# Patient Record
Sex: Male | Born: 1955 | Race: Black or African American | Hispanic: No | Marital: Single | Smoking: Never smoker
Health system: Southern US, Community
[De-identification: ages and names within clinical notes are randomized; demographics above are authoritative.]

## PROBLEM LIST (undated history)

## (undated) DIAGNOSIS — N529 Male erectile dysfunction, unspecified: Secondary | ICD-10-CM

## (undated) DIAGNOSIS — E7800 Pure hypercholesterolemia, unspecified: Secondary | ICD-10-CM

## (undated) DIAGNOSIS — I8390 Asymptomatic varicose veins of unspecified lower extremity: Secondary | ICD-10-CM

## (undated) DIAGNOSIS — I10 Essential (primary) hypertension: Secondary | ICD-10-CM

## (undated) DIAGNOSIS — K209 Esophagitis, unspecified without bleeding: Secondary | ICD-10-CM

---

## 2005-04-24 ENCOUNTER — Emergency Department: Payer: Self-pay | Admitting: General Practice

## 2007-05-07 ENCOUNTER — Ambulatory Visit: Payer: Self-pay | Admitting: Internal Medicine

## 2009-11-28 ENCOUNTER — Ambulatory Visit: Payer: Self-pay | Admitting: Internal Medicine

## 2010-10-13 DIAGNOSIS — K209 Esophagitis, unspecified without bleeding: Secondary | ICD-10-CM | POA: Insufficient documentation

## 2011-02-20 ENCOUNTER — Ambulatory Visit: Payer: Self-pay | Admitting: Internal Medicine

## 2013-12-27 DIAGNOSIS — Z0000 Encounter for general adult medical examination without abnormal findings: Secondary | ICD-10-CM | POA: Insufficient documentation

## 2013-12-27 DIAGNOSIS — N529 Male erectile dysfunction, unspecified: Secondary | ICD-10-CM | POA: Insufficient documentation

## 2016-07-02 ENCOUNTER — Encounter: Payer: Self-pay | Admitting: Emergency Medicine

## 2016-07-02 ENCOUNTER — Emergency Department
Admission: EM | Admit: 2016-07-02 | Discharge: 2016-07-02 | Disposition: A | Discharge status: Home / Self Care | Payer: Commercial Managed Care - PPO | Attending: Emergency Medicine | Admitting: Emergency Medicine

## 2016-07-02 DIAGNOSIS — Z87891 Personal history of nicotine dependence: Secondary | ICD-10-CM | POA: Insufficient documentation

## 2016-07-02 DIAGNOSIS — R509 Fever, unspecified: Secondary | ICD-10-CM | POA: Diagnosis present

## 2016-07-02 DIAGNOSIS — J111 Influenza due to unidentified influenza virus with other respiratory manifestations: Secondary | ICD-10-CM | POA: Insufficient documentation

## 2016-07-02 MED ORDER — OSELTAMIVIR PHOSPHATE 75 MG PO CAPS: 75.0000 mg | ORAL_CAPSULE | Freq: Two times a day (BID) | ORAL | 0 refills | Status: DC

## 2016-07-02 MED ORDER — AZITHROMYCIN 250 MG PO TABS: ORAL_TABLET | ORAL | 0 refills | Status: DC

## 2016-07-02 MED ORDER — PROMETHAZINE-DM 6.25-15 MG/5ML PO SYRP: 5.0000 mL | ORAL_SOLUTION | Freq: Four times a day (QID) | ORAL | 0 refills | Status: DC | PRN

## 2016-07-02 NOTE — ED Provider Notes (Signed)
[facility] Emergency Department Provider Note  ____________________________________________  Time seen: Approximately 7:22 AM  I have reviewed the triage vital signs and the nursing notes.   HISTORY  Chief Complaint Fever    HPI Ryan Gallegos is a 61 y.o. male , NAD, presents to the emergency department today history of fever, chills, body aches, cough and chest congestion. Patient states that he began to feel approximately 2 days ago. At that time felt fatigued and had some minor joint aches. Over the last 24 hours has had onset of chest congestion with productive cough and increase of body aches. Has been taking TheraFlu over-the-counter with mild relief of his symptoms but no resolution. Denies any sick contacts. Did receive the flu vaccine in September October 2017. His wife who is at the bedside notes that this is the third illness the patient has had in the last 3 months. States he has had flulike symptoms 2 times prior to this illness with the last illness approximately 3 weeks ago. Patient denies any chest pain, shortness breath, wheezing, abdominal pain, nausea, vomiting or diarrhea. Has had some mild nasal congestion but no sinus pressure, ear pressure or ear pain. Denies headaches.   History reviewed. No pertinent past medical history.  There are no active problems to display for this patient.   History reviewed. No pertinent surgical history.  Prior to Admission medications   Medication Sig Start Date End Date Taking? Authorizing Provider  azithromycin (ZITHROMAX Z-PAK) 250 MG tablet Take 2 tablets (500 mg) on  Day 1,  followed by 1 tablet (250 mg) once daily on Days 2 through 5. 07/02/16   Jami L Hagler, PA-C  oseltamivir (TAMIFLU) 75 MG capsule Take 1 capsule (75 mg total) by mouth 2 (two) times daily. 07/02/16   Jami L Hagler, PA-C  promethazine-dextromethorphan (PROMETHAZINE-DM) 6.25-15 MG/5ML syrup Take 5 mLs by mouth 4 (four) times daily as  needed for cough. 07/02/16   Jami L Hagler, PA-C    Allergies Patient has no known allergies.  No family history on file.  Social History Social History  Substance Use Topics  . Smoking status: Former Games developermoker  . Smokeless tobacco: Never Used  . Alcohol use No     Review of Systems  Constitutional: Positive fever, chills, fatigue. No decreased appetite Eyes: No visual changes.  ENT: Positive nasal congestion, runny nose. No sore throat or sinus pressure, ear pressure, ear drainage. Cardiovascular: No chest pain. Respiratory: Positive cough, chest congestion. No shortness of breath. No wheezing.  Gastrointestinal: No abdominal pain.  No nausea, vomiting.  No diarrhea.   Musculoskeletal: Positive for general myalgias. Negative for back pain.  Skin: Negative for rash. Neurological: Negative for headaches. 10-point ROS otherwise negative.  ____________________________________________   PHYSICAL EXAM:  VITAL SIGNS: ED Triage Vitals [07/02/16 0659]  Enc Vitals Group     BP 106/69     Pulse Rate 95     Resp 18     Temp 99 F (37.2 C)     Temp Source Oral     SpO2 98 %     Weight 182 lb (82.6 kg)     Height 5\' 6"  (1.676 m)     Head Circumference      Peak Flow      Pain Score 8     Pain Loc      Pain Edu?      Excl. in GC?      Constitutional: Alert and oriented. Well appearing  and in no acute distress. Eyes: Conjunctivae are normal Without icterus, injection or discharge. Head: Atraumatic. ENT:      Ears: TMs visualized bilaterally without erythema, effusion, bulging or perforation.      Nose: Mild congestion with clear rhinorrhea.      Mouth/Throat: Mucous membranes are moist. Pharynx without erythema, swelling, exudate. Uvula is midline. Airway is patent. Clear postnasal drainage. Neck: No stridor. Supple with full range of motion. Hematological/Lymphatic/Immunilogical: No cervical lymphadenopathy. Cardiovascular: Normal rate, regular rhythm. Normal S1 and S2.   Good peripheral circulation. Respiratory: Normal respiratory effort without tachypnea or retractions. Lungs CTAB with breath sounds noted in all lung fields. No wheeze, rhonchi, rales. Neurologic:  Normal speech and language. No gross focal neurologic deficits are appreciated.  Skin:  Skin is warm, dry and intact. No rash noted. Psychiatric: Mood and affect are normal. Speech and behavior are normal. Patient exhibits appropriate insight and judgement.   ____________________________________________   LABS  None ____________________________________________  EKG  None ____________________________________________  RADIOLOGY  None ____________________________________________    PROCEDURES  Procedure(s) performed: None   Procedures   Medications - No data to display   ____________________________________________   INITIAL IMPRESSION / ASSESSMENT AND PLAN / ED COURSE  Pertinent labs & imaging results that were available during my care of the patient were reviewed by me and considered in my medical decision making (see chart for details).     Patient's diagnosis is consistent with influenza.  Patient will be discharged home with prescriptions for azithromycin, Tamiflu and promethazine DM to take as directed. Patient may continue over-the-counter Tylenol or ibuprofen as needed. Patient is to follow up with [facility] or [facility] if symptoms persist past this treatment course. Patient is given ED precautions to return to the ED for any worsening or new symptoms.    ____________________________________________  FINAL CLINICAL IMPRESSION(S) / ED DIAGNOSES  Final diagnoses:  Influenza      NEW MEDICATIONS STARTED DURING THIS VISIT:  Discharge Medication List as of 07/02/2016  7:34 AM    START taking these medications   Details  azithromycin (ZITHROMAX Z-PAK) 250 MG tablet Take 2 tablets (500 mg) on  Day 1,  followed by 1 tablet (250 mg)  once daily on Days 2 through 5., Print    oseltamivir (TAMIFLU) 75 MG capsule Take 1 capsule (75 mg total) by mouth 2 (two) times daily., Starting Thu 07/02/2016, Print    promethazine-dextromethorphan (PROMETHAZINE-DM) 6.25-15 MG/5ML syrup Take 5 mLs by mouth 4 (four) times daily as needed for cough., Starting Thu 07/02/2016, Print             Ernestene Kiel Staley, PA-C 07/02/16 1610    Emily Filbert, MD 07/02/16 [phone]

## 2016-07-02 NOTE — Discharge Instructions (Signed)
Rest. Push fluids such as water and Gatorade. Alternate Tylenol and ibuprofen as needed for fever or aches.

## 2016-07-02 NOTE — ED Triage Notes (Signed)
Pt ambulatory to triage with steady gait with c/o chills, generalized body aches, and fever since yesterday. Pt reports has not checked temperature. Reports taking Theraflu and dayquil without relief. Pt afebrile in triage.

## 2016-09-09 DIAGNOSIS — Z9189 Other specified personal risk factors, not elsewhere classified: Secondary | ICD-10-CM | POA: Insufficient documentation

## 2017-09-20 DIAGNOSIS — M2060 Acquired deformities of toe(s), unspecified, unspecified foot: Secondary | ICD-10-CM | POA: Insufficient documentation

## 2017-09-20 DIAGNOSIS — I8390 Asymptomatic varicose veins of unspecified lower extremity: Secondary | ICD-10-CM | POA: Insufficient documentation

## 2018-05-15 ENCOUNTER — Other Ambulatory Visit: Payer: Self-pay

## 2018-05-15 ENCOUNTER — Emergency Department
Admission: EM | Admit: 2018-05-15 | Discharge: 2018-05-15 | Disposition: A | Discharge status: Home / Self Care | Payer: 59 | Attending: Emergency Medicine | Admitting: Emergency Medicine

## 2018-05-15 ENCOUNTER — Emergency Department: Payer: 59

## 2018-05-15 DIAGNOSIS — Z87891 Personal history of nicotine dependence: Secondary | ICD-10-CM | POA: Diagnosis not present

## 2018-05-15 DIAGNOSIS — J209 Acute bronchitis, unspecified: Secondary | ICD-10-CM | POA: Diagnosis not present

## 2018-05-15 DIAGNOSIS — R05 Cough: Secondary | ICD-10-CM | POA: Diagnosis present

## 2018-05-15 MED ORDER — PREDNISONE 10 MG (21) PO TBPK: ORAL_TABLET | ORAL | 0 refills | Status: DC

## 2018-05-15 NOTE — ED Provider Notes (Signed)
[facility] Emergency Department Provider Note  ____________________________________________  Time seen: Approximately 8:18 PM  I have reviewed the triage vital signs and the nursing notes.   HISTORY  Chief Complaint Nasal Congestion    HPI Ryan Gallegos is a 62 y.o. male presents to the emergency department with nonproductive cough for approximately 4 weeks.  Patient denies shortness of breath and fever.  Patient reports that cough seems worse at night but he does have cough during the day.  No prior history of COPD.  Patient reports that he smokes an occasional cigar but is not a daily smoker.  No recent travel.  No purulent sputum production.   History reviewed. No pertinent past medical history.  There are no active problems to display for this patient.   History reviewed. No pertinent surgical history.  Prior to Admission medications   Medication Sig Start Date End Date Taking? Authorizing Provider  azithromycin (ZITHROMAX Z-PAK) 250 MG tablet Take 2 tablets (500 mg) on  Day 1,  followed by 1 tablet (250 mg) once daily on Days 2 through 5. 07/02/16   Hagler, Jami L, PA-C  oseltamivir (TAMIFLU) 75 MG capsule Take 1 capsule (75 mg total) by mouth 2 (two) times daily. 07/02/16   Hagler, Jami L, PA-C  predniSONE (STERAPRED UNI-PAK 21 TAB) 10 MG (21) TBPK tablet Take 6 tablets the first day, take 5 tablets the second day, take 4 tablets the third day, take 3 tablets the fourth day, take 2 tablets the fifth day, take 1 tablet the sixth day. 05/15/18   Orvil FeilWoods, Sandar Krinke M, PA-C  promethazine-dextromethorphan (PROMETHAZINE-DM) 6.25-15 MG/5ML syrup Take 5 mLs by mouth 4 (four) times daily as needed for cough. 07/02/16   Hagler, Jami L, PA-C    Allergies Patient has no known allergies.  History reviewed. No pertinent family history.  Social History Social History   Tobacco Use  . Smoking status: Former Games developermoker  . Smokeless tobacco: Never Used  Substance Use  Topics  . Alcohol use: No  . Drug use: Not on file     Review of Systems  Constitutional: No fever/chills Eyes: No visual changes. No discharge ENT: No upper respiratory complaints. Cardiovascular: no chest pain. Respiratory: Patient has cough. No SOB. Gastrointestinal: No abdominal pain.  No nausea, no vomiting.  No diarrhea.  No constipation. Genitourinary: Negative for dysuria. No hematuria Musculoskeletal: Negative for musculoskeletal pain. Skin: Negative for rash, abrasions, lacerations, ecchymosis. Neurological: Negative for headaches, focal weakness or numbness.   ____________________________________________   PHYSICAL EXAM:  VITAL SIGNS: ED Triage Vitals  Enc Vitals Group     BP 05/15/18 1658 113/76     Pulse Rate 05/15/18 1658 94     Resp 05/15/18 1658 18     Temp 05/15/18 1658 97.9 F (36.6 C)     Temp Source 05/15/18 1658 Oral     SpO2 05/15/18 1658 98 %     Weight 05/15/18 1659 176 lb (79.8 kg)     Height 05/15/18 1659 5\' 6"  (1.676 m)     Head Circumference --      Peak Flow --      Pain Score 05/15/18 1659 0     Pain Loc --      Pain Edu? --      Excl. in GC? --      Constitutional: Alert and oriented. Well appearing and in no acute distress. Eyes: Conjunctivae are normal. PERRL. EOMI. Head: Atraumatic. ENT:  Ears: TMs are pearly.      Nose: No congestion/rhinnorhea.      Mouth/Throat: Mucous membranes are moist.  Neck: No stridor.  No cervical spine tenderness to palpation. Hematological/Lymphatic/Immunilogical: No cervical lymphadenopathy. Cardiovascular: Normal rate, regular rhythm. Normal S1 and S2.  Good peripheral circulation. Respiratory: Normal respiratory effort without tachypnea or retractions. Lungs CTAB. Good air entry to the bases with no decreased or absent breath sounds. Gastrointestinal: Bowel sounds 4 quadrants. Soft and nontender to palpation. No guarding or rigidity. No palpable masses. No distention. No CVA  tenderness. Musculoskeletal: Full range of motion to all extremities. No gross deformities appreciated. Neurologic:  Normal speech and language. No gross focal neurologic deficits are appreciated.  Skin:  Skin is warm, dry and intact. No rash noted. Psychiatric: Mood and affect are normal. Speech and behavior are normal. Patient exhibits appropriate insight and judgement.   ____________________________________________   LABS (all labs ordered are listed, but only abnormal results are displayed)  Labs Reviewed - No data to display ____________________________________________  EKG   ____________________________________________  RADIOLOGY I personally viewed and evaluated these images as part of my medical decision making, as well as reviewing the written report by the radiologist.  Dg Chest 2 View  Result Date: 05/15/2018 CLINICAL DATA:  Cough, congestion EXAM: CHEST - 2 VIEW COMPARISON:  None. FINDINGS: Heart and mediastinal contours are within normal limits. No focal opacities or effusions. No acute bony abnormality. IMPRESSION: No active cardiopulmonary disease. Electronically Signed   By: Charlett Nose M.D.   On: 05/15/2018 19:23    ____________________________________________    PROCEDURES  Procedure(s) performed:    Procedures    Medications - No data to display   ____________________________________________   INITIAL IMPRESSION / ASSESSMENT AND PLAN / ED COURSE  Pertinent labs & imaging results that were available during my care of the patient were reviewed by me and considered in my medical decision making (see chart for details).  Review of the [place] CSRS was performed in accordance of the [facility] prior to dispensing any controlled drugs.    Assessment and plan Cough Patient presents to the emergency department with nonproductive cough for approximately 4 weeks.  Patient has had no shortness of breath, fatigue or fever at home.  Chest x-ray reveals no  consolidations, opacities or infiltrates that would suggest community-acquired pneumonia.  Overall physical exam was reassuring.  Auscultation of the lungs did not reveal any adventitious lung sounds.  Patient was discharged with prednisone.  Vital signs were reassuring prior to discharge.  All patient questions were answered.  ____________________________________________  FINAL CLINICAL IMPRESSION(S) / ED DIAGNOSES  Final diagnoses:  Acute bronchitis, unspecified organism      NEW MEDICATIONS STARTED DURING THIS VISIT:  ED Discharge Orders         Ordered    predniSONE (STERAPRED UNI-PAK 21 TAB) 10 MG (21) TBPK tablet     05/15/18 2015              This chart was dictated using voice recognition software/Dragon. Despite best efforts to proofread, errors can occur which can change the meaning. Any change was purely unintentional.    Gasper Lloyd 05/15/18 2021    Jene Every, MD 05/15/18 2039

## 2018-05-15 NOTE — ED Triage Notes (Signed)
Cough and congestion x 1 month. A&O, ambulatory. No distress noted.

## 2019-01-12 DIAGNOSIS — Z8619 Personal history of other infectious and parasitic diseases: Secondary | ICD-10-CM | POA: Insufficient documentation

## 2019-08-23 ENCOUNTER — Emergency Department
Admission: EM | Admit: 2019-08-23 | Discharge: 2019-08-23 | Disposition: A | Discharge status: Home / Self Care | Payer: Commercial Managed Care - PPO | Attending: Student in an Organized Health Care Education/Training Program | Admitting: Student in an Organized Health Care Education/Training Program

## 2019-08-23 ENCOUNTER — Other Ambulatory Visit: Payer: Self-pay

## 2019-08-23 ENCOUNTER — Emergency Department: Payer: Commercial Managed Care - PPO

## 2019-08-23 DIAGNOSIS — Z87891 Personal history of nicotine dependence: Secondary | ICD-10-CM | POA: Diagnosis not present

## 2019-08-23 DIAGNOSIS — S61012A Laceration without foreign body of left thumb without damage to nail, initial encounter: Secondary | ICD-10-CM | POA: Insufficient documentation

## 2019-08-23 DIAGNOSIS — W312XXA Contact with powered woodworking and forming machines, initial encounter: Secondary | ICD-10-CM | POA: Insufficient documentation

## 2019-08-23 DIAGNOSIS — Y939 Activity, unspecified: Secondary | ICD-10-CM | POA: Diagnosis not present

## 2019-08-23 DIAGNOSIS — Z23 Encounter for immunization: Secondary | ICD-10-CM | POA: Insufficient documentation

## 2019-08-23 DIAGNOSIS — S6992XA Unspecified injury of left wrist, hand and finger(s), initial encounter: Secondary | ICD-10-CM | POA: Diagnosis present

## 2019-08-23 DIAGNOSIS — Y929 Unspecified place or not applicable: Secondary | ICD-10-CM | POA: Diagnosis not present

## 2019-08-23 DIAGNOSIS — Y999 Unspecified external cause status: Secondary | ICD-10-CM | POA: Insufficient documentation

## 2019-08-23 MED ORDER — TETANUS-DIPHTH-ACELL PERTUSSIS 5-2.5-18.5 LF-MCG/0.5 IM SUSP
0.5000 mL | Freq: Once | INTRAMUSCULAR | Status: AC
  Administered 2019-08-23: 0.5 mL via INTRAMUSCULAR
  Filled 2019-08-23: qty 0.5

## 2019-08-23 MED ORDER — CEPHALEXIN 500 MG PO CAPS: 500.0000 mg | ORAL_CAPSULE | Freq: Three times a day (TID) | ORAL | 0 refills | Status: AC

## 2019-08-23 NOTE — Discharge Instructions (Signed)
Keep dressing in place for the next 24 hours. Surgicel "anticlot paper" will degrade on its own and does not have to be removed. Take Keflex 3 times a day for the next week to prevent infection. Return to the emergency department with new or worsening symptoms.

## 2019-08-23 NOTE — ED Triage Notes (Signed)
Pt states around 1500 he was using circular saw when it cut his left thumb. Bloody dressing noted to area. Avulsion noted to part of nail bed and tip of thumb. Bleeding noted at this time, dressing applied.

## 2019-08-23 NOTE — ED Provider Notes (Signed)
Emergency Department Provider Note  ____________________________________________  Time seen: Approximately 11:10 PM  I have reviewed the triage vital signs and the nursing notes.   HISTORY  Chief Complaint Laceration   Historian Patient     HPI Ryan Gallegos is a 64 y.o. male presents to the emergency department with an avulsion type laceration along the left thumb involving the distal aspect of the fingernail.  Patient states that he was unable to stop bleeding at home.  No numbness or tingling in the left thumb.  He cannot recall his last tetanus shot.  No other alleviating measures have been attempted.   No past medical history on file.   Immunizations up to date:  Yes.     No past medical history on file.  There are no problems to display for this patient.   No past surgical history on file.  Prior to Admission medications   Medication Sig Start Date End Date Taking? Authorizing Provider  azithromycin (ZITHROMAX Z-PAK) 250 MG tablet Take 2 tablets (500 mg) on  Day 1,  followed by 1 tablet (250 mg) once daily on Days 2 through 5. 07/02/16   Hagler, Jami L, PA-C  oseltamivir (TAMIFLU) 75 MG capsule Take 1 capsule (75 mg total) by mouth 2 (two) times daily. 07/02/16   Hagler, Jami L, PA-C  predniSONE (STERAPRED UNI-PAK 21 TAB) 10 MG (21) TBPK tablet Take 6 tablets the first day, take 5 tablets the second day, take 4 tablets the third day, take 3 tablets the fourth day, take 2 tablets the fifth day, take 1 tablet the sixth day. 05/15/18   Orvil Feil, PA-C  promethazine-dextromethorphan (PROMETHAZINE-DM) 6.25-15 MG/5ML syrup Take 5 mLs by mouth 4 (four) times daily as needed for cough. 07/02/16   Hagler, Jami L, PA-C    Allergies Patient has no known allergies.  No family history on file.  Social History Social History   Tobacco Use  . Smoking status: Former Games developer  . Smokeless tobacco: Never Used  Substance Use Topics  . Alcohol use: No  . Drug use: Not  on file     Review of Systems  Constitutional: No fever/chills Eyes:  No discharge ENT: No upper respiratory complaints. Respiratory: no cough. No SOB/ use of accessory muscles to breath Gastrointestinal:   No nausea, no vomiting.  No diarrhea.  No constipation. Musculoskeletal: No flexor or extensor tendon deficits of left thumb. Skin: Patient has avulsion type laceration of left thumb.    ____________________________________________   PHYSICAL EXAM:  VITAL SIGNS: ED Triage Vitals  Enc Vitals Group     BP 08/23/19 2210 (!) 155/84     Pulse Rate 08/23/19 2210 96     Resp 08/23/19 2210 20     Temp 08/23/19 2210 98.6 F (37 C)     Temp src --      SpO2 08/23/19 2210 100 %     Weight 08/23/19 2216 170 lb (77.1 kg)     Height --      Head Circumference --      Peak Flow --      Pain Score 08/23/19 2216 0     Pain Loc --      Pain Edu? --      Excl. in GC? --      Constitutional: Alert and oriented. Well appearing and in no acute distress. Eyes: Conjunctivae are normal. PERRL. EOMI. Head: Atraumatic. Cardiovascular: Normal rate, regular rhythm. Normal S1 and S2.  Good peripheral circulation.  Respiratory: Normal respiratory effort without tachypnea or retractions. Lungs CTAB. Good air entry to the bases with no decreased or absent breath sounds Musculoskeletal: No flexor or extensor tendon deficits appreciated with testing of the left thumb. Neurologic:  Normal for age. No gross focal neurologic deficits are appreciated.  Skin: Patient has a 2 cm in length by 1/2 cm in width avulsion type laceration involving the distal aspect of the left fingernail. Psychiatric: Mood and affect are normal for age. Speech and behavior are normal.   ____________________________________________   LABS (all labs ordered are listed, but only abnormal results are displayed)  Labs Reviewed - No data to  display ____________________________________________  EKG   ____________________________________________  RADIOLOGY Geraldo Pitter, personally viewed and evaluated these images (plain radiographs) as part of my medical decision making, as well as reviewing the written report by the radiologist.  DG Finger Thumb Left  Result Date: 08/23/2019 CLINICAL DATA:  64 year old male with laceration of the left thumb. EXAM: LEFT THUMB 2+V COMPARISON:  None. FINDINGS: There is no acute fracture or dislocation. No significant arthritic changes. There is laceration of the soft tissues of the distal thumb. No radiopaque foreign object or soft tissue gas. IMPRESSION: No acute osseous pathology. Electronically Signed   By: Elgie Collard M.D.   On: 08/23/2019 22:44    ____________________________________________    PROCEDURES  Procedure(s) performed:     Marland KitchenMarland KitchenLaceration Repair  Date/Time: 08/23/2019 11:14 PM Performed by: Orvil Feil, PA-C Authorized by: Orvil Feil, PA-C   Consent:    Consent obtained:  Verbal   Consent given by:  Patient Anesthesia (see MAR for exact dosages):    Anesthesia method:  None Laceration details:    Location:  Finger   Finger location:  L thumb   Wound length (cm): 2 cm x 0.5 cm.   Depth (mm):  1 Repair type:    Repair type:  Simple Exploration:    Hemostasis achieved with:  Direct pressure   Contaminated: no   Treatment:    Area cleansed with:  Betadine   Amount of cleaning:  Standard   Irrigation solution:  Sterile saline   Visualized foreign bodies/material removed: no   Skin repair:    Repair method: Laceration repaired with compression.  Not conducive to repair with suture. Approximation:    Approximation:  Close Post-procedure details:    Dressing:  Open (no dressing)   Patient tolerance of procedure:  Tolerated well, no immediate complications       Medications  Tdap (BOOSTRIX) injection 0.5 mL (has no administration in  time range)     ____________________________________________   INITIAL IMPRESSION / ASSESSMENT AND PLAN / ED COURSE  Pertinent labs & imaging results that were available during my care of the patient were reviewed by me and considered in my medical decision making (see chart for details).      Assessment and Plan:  Avulsion type laceration.  64 year old male presents to the emergency department with an avulsion type laceration of the left thumb.  No bony abnormality was visualized on x-ray of the left thumb.  Bleeding was stopped using compression and Surgicel.  Patient education regarding wound care was given.  Tetanus was updated prior to discharge.  Patient was discharged with Keflex.     ____________________________________________  FINAL CLINICAL IMPRESSION(S) / ED DIAGNOSES  Final diagnoses:  None      NEW MEDICATIONS STARTED DURING THIS VISIT:  ED Discharge Orders    None  This chart was dictated using voice recognition software/Dragon. Despite best efforts to proofread, errors can occur which can change the meaning. Any change was purely unintentional.     Lannie Fields, PA-C 08/23/19 2316    Merlyn Lot, MD 08/23/19 [phone]

## 2020-05-13 ENCOUNTER — Encounter: Payer: Self-pay | Admitting: Emergency Medicine

## 2020-05-13 ENCOUNTER — Emergency Department: Payer: Commercial Managed Care - PPO

## 2020-05-13 ENCOUNTER — Emergency Department
Admission: EM | Admit: 2020-05-13 | Discharge: 2020-05-13 | Disposition: A | Discharge status: Home / Self Care | Payer: Commercial Managed Care - PPO | Attending: Emergency Medicine | Admitting: Emergency Medicine

## 2020-05-13 ENCOUNTER — Other Ambulatory Visit: Payer: Self-pay

## 2020-05-13 DIAGNOSIS — J069 Acute upper respiratory infection, unspecified: Secondary | ICD-10-CM | POA: Diagnosis not present

## 2020-05-13 DIAGNOSIS — R059 Cough, unspecified: Secondary | ICD-10-CM | POA: Diagnosis present

## 2020-05-13 DIAGNOSIS — Z20822 Contact with and (suspected) exposure to covid-19: Secondary | ICD-10-CM | POA: Diagnosis not present

## 2020-05-13 DIAGNOSIS — Z87891 Personal history of nicotine dependence: Secondary | ICD-10-CM | POA: Insufficient documentation

## 2020-05-13 LAB — RESP PANEL BY RT-PCR (FLU A&B, COVID) ARPGX2
Influenza A by PCR: NEGATIVE
Influenza B by PCR: NEGATIVE
SARS Coronavirus 2 by RT PCR: NEGATIVE

## 2020-05-13 MED ORDER — BENZONATATE 100 MG PO CAPS
200.0000 mg | ORAL_CAPSULE | Freq: Once | ORAL | Status: AC
  Administered 2020-05-13: 08:00:00 200 mg via ORAL
  Filled 2020-05-13: qty 2

## 2020-05-13 MED ORDER — PSEUDOEPH-BROMPHEN-DM 30-2-10 MG/5ML PO SYRP: 5.0000 mL | ORAL_SOLUTION | Freq: Four times a day (QID) | ORAL | 0 refills | Status: DC | PRN

## 2020-05-13 NOTE — Discharge Instructions (Signed)
Your test was negative for COVID-19 influenza.  Your chest x-ray did was negative for pneumonia or bronchitis.  Follow discharge care instruction take medication as directed.

## 2020-05-13 NOTE — ED Triage Notes (Signed)
Pt arrived via POV with reports of cough and chills x 1 week with productive phlegm at times, pt states he has been taking OTC meds without minimal relief.

## 2020-05-13 NOTE — ED Provider Notes (Signed)
[facility] Emergency Department Provider Note   ____________________________________________   None    (approximate)  I have reviewed the triage vital signs and the nursing notes.   HISTORY  Chief Complaint Cough and Chills    HPI Ryan Gallegos is a 64 y.o. male patient presents with cough and chills 1 week.  Patient states cough is intermittently productive.  Patient no relief over-the-counter preparations.  Denies recent travel or known contact with COVID-19.  Patient has taken all 3 COVID-19 vaccines.  Patient has taken the flu shot.         History reviewed. No pertinent past medical history.  There are no problems to display for this patient.   History reviewed. No pertinent surgical history.  Prior to Admission medications   Medication Sig Start Date End Date Taking? Authorizing Provider  azithromycin (ZITHROMAX Z-PAK) 250 MG tablet Take 2 tablets (500 mg) on  Day 1,  followed by 1 tablet (250 mg) once daily on Days 2 through 5. 07/02/16   Hagler, Jami L, PA-C  brompheniramine-pseudoephedrine-DM 30-2-10 MG/5ML syrup Take 5 mLs by mouth 4 (four) times daily as needed. 05/13/20   Joni Reining, PA-C  oseltamivir (TAMIFLU) 75 MG capsule Take 1 capsule (75 mg total) by mouth 2 (two) times daily. 07/02/16   Hagler, Jami L, PA-C  predniSONE (STERAPRED UNI-PAK 21 TAB) 10 MG (21) TBPK tablet Take 6 tablets the first day, take 5 tablets the second day, take 4 tablets the third day, take 3 tablets the fourth day, take 2 tablets the fifth day, take 1 tablet the sixth day. 05/15/18   Orvil Feil, PA-C  promethazine-dextromethorphan (PROMETHAZINE-DM) 6.25-15 MG/5ML syrup Take 5 mLs by mouth 4 (four) times daily as needed for cough. 07/02/16   Hagler, Jami L, PA-C    Allergies Patient has no known allergies.  No family history on file.  Social History Social History   Tobacco Use  . Smoking status: Former Games developer  . Smokeless tobacco: Never Used   Vaping Use  . Vaping Use: Never used  Substance Use Topics  . Alcohol use: No    Review of Systems Constitutional: Chills.  Eyes: No visual changes. ENT: No sore throat. Cardiovascular: Denies chest pain. Respiratory: Denies shortness of breath.  Productive cough. Gastrointestinal: No abdominal pain.  No nausea, no vomiting.  No diarrhea.  No constipation. Genitourinary: Negative for dysuria. Musculoskeletal: Negative for back pain. Skin: Negative for rash. Neurological: Negative for headaches, focal weakness or numbness.   ____________________________________________   PHYSICAL EXAM:  VITAL SIGNS: ED Triage Vitals  Enc Vitals Group     BP 05/13/20 0546 (!) 145/89     Pulse Rate 05/13/20 0546 75     Resp 05/13/20 0546 18     Temp 05/13/20 0546 98.2 F (36.8 C)     Temp Source 05/13/20 0546 Oral     SpO2 05/13/20 0546 100 %     Weight 05/13/20 0544 171 lb (77.6 kg)     Height 05/13/20 0544 5\' 6"  (1.676 m)     Head Circumference --      Peak Flow --      Pain Score 05/13/20 0546 0     Pain Loc --      Pain Edu? --      Excl. in GC? --    Constitutional: Alert and oriented. Well appearing and in no acute distress. Eyes: Conjunctivae are normal. PERRL. EOMI. Head: Atraumatic. Nose: No congestion/rhinnorhea. Mouth/Throat:  Mucous membranes are moist.  Oropharynx non-erythematous. Neck: No stridor.  Hematological/Lymphatic/Immunilogical: No cervical lymphadenopathy. Cardiovascular: Normal rate, regular rhythm. Grossly normal heart sounds.  Good peripheral circulation. Respiratory: Normal respiratory effort.  No retractions. Lungs CTAB.  Gastrointestinal: Soft and nontender. No distention. No abdominal bruits. No CVA tenderness. Skin:  Skin is warm, dry and intact. No rash noted. Psychiatric: Mood and affect are normal. Speech and behavior are normal.  ____________________________________________   LABS (all labs ordered are listed, but only abnormal results are  displayed)  Labs Reviewed  RESP PANEL BY RT-PCR (FLU A&B, COVID) [id number]   ____________________________________________  EKG   ____________________________________________  RADIOLOGY I, Joni Reining, personally viewed and evaluated these images (plain radiographs) as part of my medical decision making, as well as reviewing the written report by the radiologist.  ED MD interpretation: No acute findings on chest x-ray. Official radiology report(s): DG Chest 2 View  Result Date: 05/13/2020 CLINICAL DATA:  1 week history of productive cough. EXAM: CHEST - 2 VIEW COMPARISON:  05/15/2018. FINDINGS: The lungs are clear without focal pneumonia, edema, pneumothorax or pleural effusion. The cardiopericardial silhouette is within normal limits for size. The visualized bony structures of the thorax show no acute abnormality. Dystrophic calcification or clustered radiopaque foreign bodies again identified superimposed on the right axillary region, stable in the interval. IMPRESSION: No active cardiopulmonary disease. Electronically Signed   By: Kennith Center M.D.   On: 05/13/2020 06:31    ____________________________________________   PROCEDURES  Procedure(s) performed (including Critical Care):  Procedures   ____________________________________________   INITIAL IMPRESSION / ASSESSMENT AND PLAN / ED COURSE  As part of my medical decision making, I reviewed the following data within the electronic MEDICAL RECORD NUMBER         Patient presents with 1 week of productive cough.  Discussed negative Covid and flu findings.  Patient chest x-ray was unremarkable.  Patient complaining physical exam consistent with viral respiratory infection with cough.  Patient given discharge care instruction advised take medication as directed.  Patient advised follow-up PCP.      ____________________________________________   FINAL CLINICAL IMPRESSION(S) / ED DIAGNOSES  Final diagnoses:  Viral URI  with cough     ED Discharge Orders         Ordered    brompheniramine-pseudoephedrine-DM 30-2-10 MG/5ML syrup  4 times daily PRN        05/13/20 0725          *Please note:  Ryan Gallegos was evaluated in Emergency Department on 05/13/2020 for the symptoms described in the history of present illness. He was evaluated in the context of the global COVID-19 pandemic, which necessitated consideration that the patient might be at risk for infection with the SARS-CoV-2 virus that causes COVID-19. Institutional protocols and algorithms that pertain to the evaluation of patients at risk for COVID-19 are in a state of rapid change based on information released by regulatory bodies including the CDC and federal and state organizations. These policies and algorithms were followed during the patient's care in the ED.  Some ED evaluations and interventions may be delayed as a result of limited staffing during and the pandemic.*   Note:  This document was prepared using Dragon voice recognition software and may include unintentional dictation errors.    Joni Reining, PA-C 05/13/20 4401    Minna Antis, MD 05/13/20 1354

## 2021-01-15 ENCOUNTER — Ambulatory Visit: Payer: Commercial Managed Care - PPO | Admitting: Podiatry

## 2021-01-15 ENCOUNTER — Other Ambulatory Visit: Payer: Self-pay

## 2021-01-15 ENCOUNTER — Encounter: Payer: Self-pay | Admitting: Podiatry

## 2021-01-15 DIAGNOSIS — M2041 Other hammer toe(s) (acquired), right foot: Secondary | ICD-10-CM | POA: Diagnosis not present

## 2021-01-15 DIAGNOSIS — B353 Tinea pedis: Secondary | ICD-10-CM | POA: Diagnosis not present

## 2021-01-15 DIAGNOSIS — L84 Corns and callosities: Secondary | ICD-10-CM

## 2021-01-15 MED ORDER — KETOCONAZOLE 2 % EX CREA: 1.0000 "application " | TOPICAL_CREAM | Freq: Every day | CUTANEOUS | 2 refills | Status: AC

## 2021-01-20 NOTE — Progress Notes (Signed)
  Subjective:  Patient ID: Ryan Gallegos, male    DOB: 1956-03-17,  MRN: [id number]  Chief Complaint  Patient presents with   Toe Pain     np-pain in 5th right toe-req day/time-dr. Clydie Braun kimel-scott refer    65 y.o. male presents with the above complaint. History confirmed with patient.   Objective:  Physical Exam: warm, good capillary refill, no trophic changes or ulcerative lesions, normal DP and PT pulses, and normal sensory exam.  Right Foot: He has hammertoe deformities of the [place] and interdigital corn on the right foot  Assessment:   1. Soft corn   2. Hammertoe of right foot   3. Tinea pedis, right      Plan:  Patient was evaluated and treated and all questions answered.  Discussed the interdigital corn he has and how this relates to his hammertoe deformities.  We discussed gust surgical and nonsurgical correction.  I dispensed him silicone toe caps I debrided the lesion and dispensed him ketoconazole to clear up any tinea pedis.  He will return in 1 month.  If still painful we will take x-rays and discuss surgical planning  Return in about 1 month (around 02/15/2021) for f/u corn of right foot .

## 2021-02-19 ENCOUNTER — Ambulatory Visit: Payer: Commercial Managed Care - PPO | Admitting: Podiatry

## 2021-03-07 ENCOUNTER — Emergency Department
Admission: EM | Admit: 2021-03-07 | Discharge: 2021-03-07 | Disposition: A | Discharge status: Home / Self Care | Payer: Commercial Managed Care - PPO | Attending: Emergency Medicine | Admitting: Emergency Medicine

## 2021-03-07 ENCOUNTER — Other Ambulatory Visit: Payer: Self-pay

## 2021-03-07 ENCOUNTER — Encounter: Payer: Self-pay | Admitting: Emergency Medicine

## 2021-03-07 DIAGNOSIS — L509 Urticaria, unspecified: Secondary | ICD-10-CM | POA: Diagnosis not present

## 2021-03-07 DIAGNOSIS — Z7982 Long term (current) use of aspirin: Secondary | ICD-10-CM | POA: Diagnosis not present

## 2021-03-07 DIAGNOSIS — Z87891 Personal history of nicotine dependence: Secondary | ICD-10-CM | POA: Diagnosis not present

## 2021-03-07 MED ORDER — DIPHENHYDRAMINE HCL 25 MG PO CAPS
50.0000 mg | ORAL_CAPSULE | Freq: Once | ORAL | Status: AC
Start: 2021-03-07 — End: 2021-03-07
  Administered 2021-03-07: 50 mg via ORAL
  Filled 2021-03-07: qty 2

## 2021-03-07 MED ORDER — PREDNISONE 10 MG (21) PO TBPK: ORAL_TABLET | ORAL | 0 refills | Status: AC

## 2021-03-07 MED ORDER — DIPHENHYDRAMINE HCL 50 MG PO TABS: 50.0000 mg | ORAL_TABLET | Freq: Four times a day (QID) | ORAL | 0 refills | Status: AC | PRN

## 2021-03-07 MED ORDER — PREDNISONE 20 MG PO TABS
60.0000 mg | ORAL_TABLET | Freq: Once | ORAL | Status: AC
  Administered 2021-03-07: 60 mg via ORAL
  Filled 2021-03-07: qty 3

## 2021-03-07 NOTE — ED Triage Notes (Signed)
Pt to ED from home c/o bilateral feet swelling for a couple days, painful, also hives and itching, denies tongue swelling or trouble breathing.  States peanuts sometimes give him issues but has not had any, denies changes in soaps or detergents.  Pt took benadryl last night around 1900.  Pt A&Ox4, chest rise even and unlabored, in NAD at this time.

## 2021-03-07 NOTE — ED Provider Notes (Signed)
[facility] Emergency Department Provider Note  ____________________________________________   Event Date/Time   First MD Initiated Contact with Patient 03/07/21 [phone]     (approximate)  I have reviewed the triage vital signs and the nursing notes.   HISTORY  Chief Complaint Urticaria and Foot Swelling    HPI Ryan Gallegos is a 65 y.o. male with history of hepatitis C who presents to the emergency department with 2 to 3 days of diffuse pruritic hives.  He denies any new exposures.  No lip or tongue swelling.  No difficulty breathing.  No fevers.  No tick bites.  He also feels like his feet are swollen and he has never had this before.  No chest pain.  No history of CHF.  No calf tenderness or calf swelling.        History reviewed. No pertinent past medical history.  Patient Active Problem List   Diagnosis Date Noted   Hepatitis C virus infection cured after antiviral drug therapy 01/12/2019   Toe deformity 09/20/2017   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 No pertinent surgical history.  Prior to Admission medications   Medication Sig Start Date End Date Taking? Authorizing Provider  diphenhydrAMINE (BENADRYL) 50 MG tablet Take 1 tablet (50 mg total) by mouth every 6 (six) hours as needed for itching or allergies. 03/07/21  Yes Christyn Gutkowski, Layla Maw, DO  predniSONE (STERAPRED UNI-PAK 21 TAB) 10 MG (21) TBPK tablet Take as directed.  Start on 03/08/21. 03/07/21  Yes Brownie Nehme, Layla Maw, DO  aspirin 81 MG EC tablet Take 1 tablet by mouth daily. 12/15/16   [provider]  ketoconazole (NIZORAL) 2 % cream Apply 1 application topically daily. 01/15/21   McDonald, Rachelle Hora, DPM  omeprazole (PRILOSEC) 20 MG capsule Take 20 mg by mouth daily. 09/24/20   [provider]  sildenafil (VIAGRA) 100 MG tablet PLEASE SEE ATTACHED FOR DETAILED DIRECTIONS 12/27/20   [provider]    Allergies Patient has no known allergies.  History reviewed. No pertinent family history.  Social History Social History   Tobacco Use   Smoking status: Former   Smokeless tobacco: Never  Building services engineer Use: Never used  Substance Use Topics   Alcohol use: No   Drug use: Not Currently    Types: Marijuana    Review of Systems Constitutional: No fever. Eyes: No visual changes. ENT: No sore throat. Cardiovascular: Denies chest pain. Respiratory: Denies shortness of breath. Gastrointestinal: No nausea, vomiting, diarrhea. Genitourinary: Negative for dysuria. Musculoskeletal: Negative for back pain. Skin: + For urticaria Neurological: Negative for focal weakness or numbness.  ____________________________________________   PHYSICAL EXAM:  VITAL SIGNS: ED Triage Vitals  Enc Vitals Group     BP 03/07/21 0409 132/87     Pulse Rate 03/07/21 0409 81     Resp 03/07/21 0409 16     Temp 03/07/21 0409 98.2 F (36.8 C)     Temp Source 03/07/21 0409 Oral     SpO2 03/07/21 0409 98 %     Weight 03/07/21 0410 165 lb (74.8 kg)     Height 03/07/21 0410 5\' 6"  (1.676 m)     Head Circumference --      Peak Flow --      Pain Score 03/07/21  0416 0     Pain Loc --      Pain Edu? --      Excl. in GC? --    CONSTITUTIONAL: Alert and oriented and responds appropriately to questions. Well-appearing; well-nourished HEAD: Normocephalic EYES: Conjunctivae clear, pupils appear equal, EOM appear intact ENT: normal nose; moist mucous membranes, no angioedema, normal phonation, no trismus or drooling, tongue sits flat in the bottom of the mouth NECK: Supple, normal ROM CARD: RRR; S1 and S2 appreciated; no murmurs, no clicks, no rubs, no gallops RESP: Normal chest excursion without splinting or tachypnea; breath sounds clear and equal bilaterally; no  wheezes, no rhonchi, no rales, no hypoxia or respiratory distress, speaking full sentences ABD/GI: Normal bowel sounds; non-distended; soft, non-tender, no rebound, no guarding, no peritoneal signs, no hepatosplenomegaly BACK: The back appears normal EXT: Normal ROM in all joints; no deformity noted, no edema; no cyanosis, 2+ DP pulses in his feet bilaterally SKIN: Normal color for age and race; warm; diffuse urticaria to his upper extremities and torso, no blisters or desquamation, no petechiae or purpura, no bull's-eye rash, no rash on the palms or soles NEURO: Moves all extremities equally PSYCH: The patient's mood and manner are appropriate.  ____________________________________________   LABS (all labs ordered are listed, but only abnormal results are displayed)  Labs Reviewed - No data to display ____________________________________________  EKG   ____________________________________________  RADIOLOGY I, Alson Mcpheeters, personally viewed and evaluated these images (plain radiographs) as part of my medical decision making, as well as reviewing the written report by the radiologist.  ED MD interpretation:    Official radiology report(s): No results found.  ____________________________________________   PROCEDURES  Procedure(s) performed (including Critical Care):  Procedures   ____________________________________________   INITIAL IMPRESSION / ASSESSMENT AND PLAN / ED COURSE  As part of my medical decision making, I reviewed the following data within the electronic MEDICAL RECORD NUMBER Nursing notes reviewed and incorporated, Old chart reviewed, and Notes from prior ED visits         Patient here with urticaria.  Will give prednisone, Benadryl.  Have advised him to follow-up with an allergy specialist as an outpatient.  He has no sign of any life-threatening rash.  No systemic symptoms otherwise have an allergic reaction.  Lungs are clear.  No hypoxia.  No  hypotension.  No airway involvement.  He also feels like his feet are swollen but I do not appreciate any swelling.  He has no signs of gout, cellulitis, abscess, fracture, septic arthritis, DVT, arterial obstruction on exam.  He does not look like he is volume overloaded.  No known history of CHF.  No chest pain or shortness of breath.  At this time, I do not feel there is any life-threatening condition present. I have reviewed, interpreted and discussed all results (EKG, imaging, lab, urine as appropriate) and exam findings with patient/family. I have reviewed nursing notes and appropriate previous records.  I feel the patient is safe to be discharged home without further emergent workup and can continue workup as an outpatient as needed. Discussed usual and customary return precautions. Patient/family verbalize understanding and are comfortable with this plan.  Outpatient follow-up has been provided as needed. All questions have been answered.  ____________________________________________   FINAL CLINICAL IMPRESSION(S) / ED DIAGNOSES  Final diagnoses:  Urticaria     ED Discharge Orders          Ordered    diphenhydrAMINE (BENADRYL) 50 MG tablet  Every 6 hours  PRN        03/07/21 0544    predniSONE (STERAPRED UNI-PAK 21 TAB) 10 MG (21) TBPK tablet        03/07/21 0544            *Please note:  Ryan Gallegos was evaluated in Emergency Department on 03/07/2021 for the symptoms described in the history of present illness. He was evaluated in the context of the global COVID-19 pandemic, which necessitated consideration that the patient might be at risk for infection with the SARS-CoV-2 virus that causes COVID-19. Institutional protocols and algorithms that pertain to the evaluation of patients at risk for COVID-19 are in a state of rapid change based on information released by regulatory bodies including the CDC and federal and state organizations. These policies and algorithms were  followed during the patient's care in the ED.  Some ED evaluations and interventions may be delayed as a result of limited staffing during and the pandemic.*   Note:  This document was prepared using Dragon voice recognition software and may include unintentional dictation errors.    Latreece Mochizuki, Layla Maw, DO 03/07/21 [phone]

## 2021-03-07 NOTE — Discharge Instructions (Signed)
Steps to find a Primary Care Provider (PCP):  Call [phone] or [phone] to access "[place] Find a Doctor Service."  2.  You may also go on the [place] website at [email or web address]  

## 2021-08-27 IMAGING — CR DG CHEST 2V
1 series · 2 of 2 positions shown · non-contrast
Comparison: 05/15/2018.

CLINICAL DATA: 1 week history of productive cough.

EXAM:
CHEST - 2 VIEW

[Series 1: dg chest 2 view · 0.14mm/px · 2 of 2 slices shown]
[im 1/2]
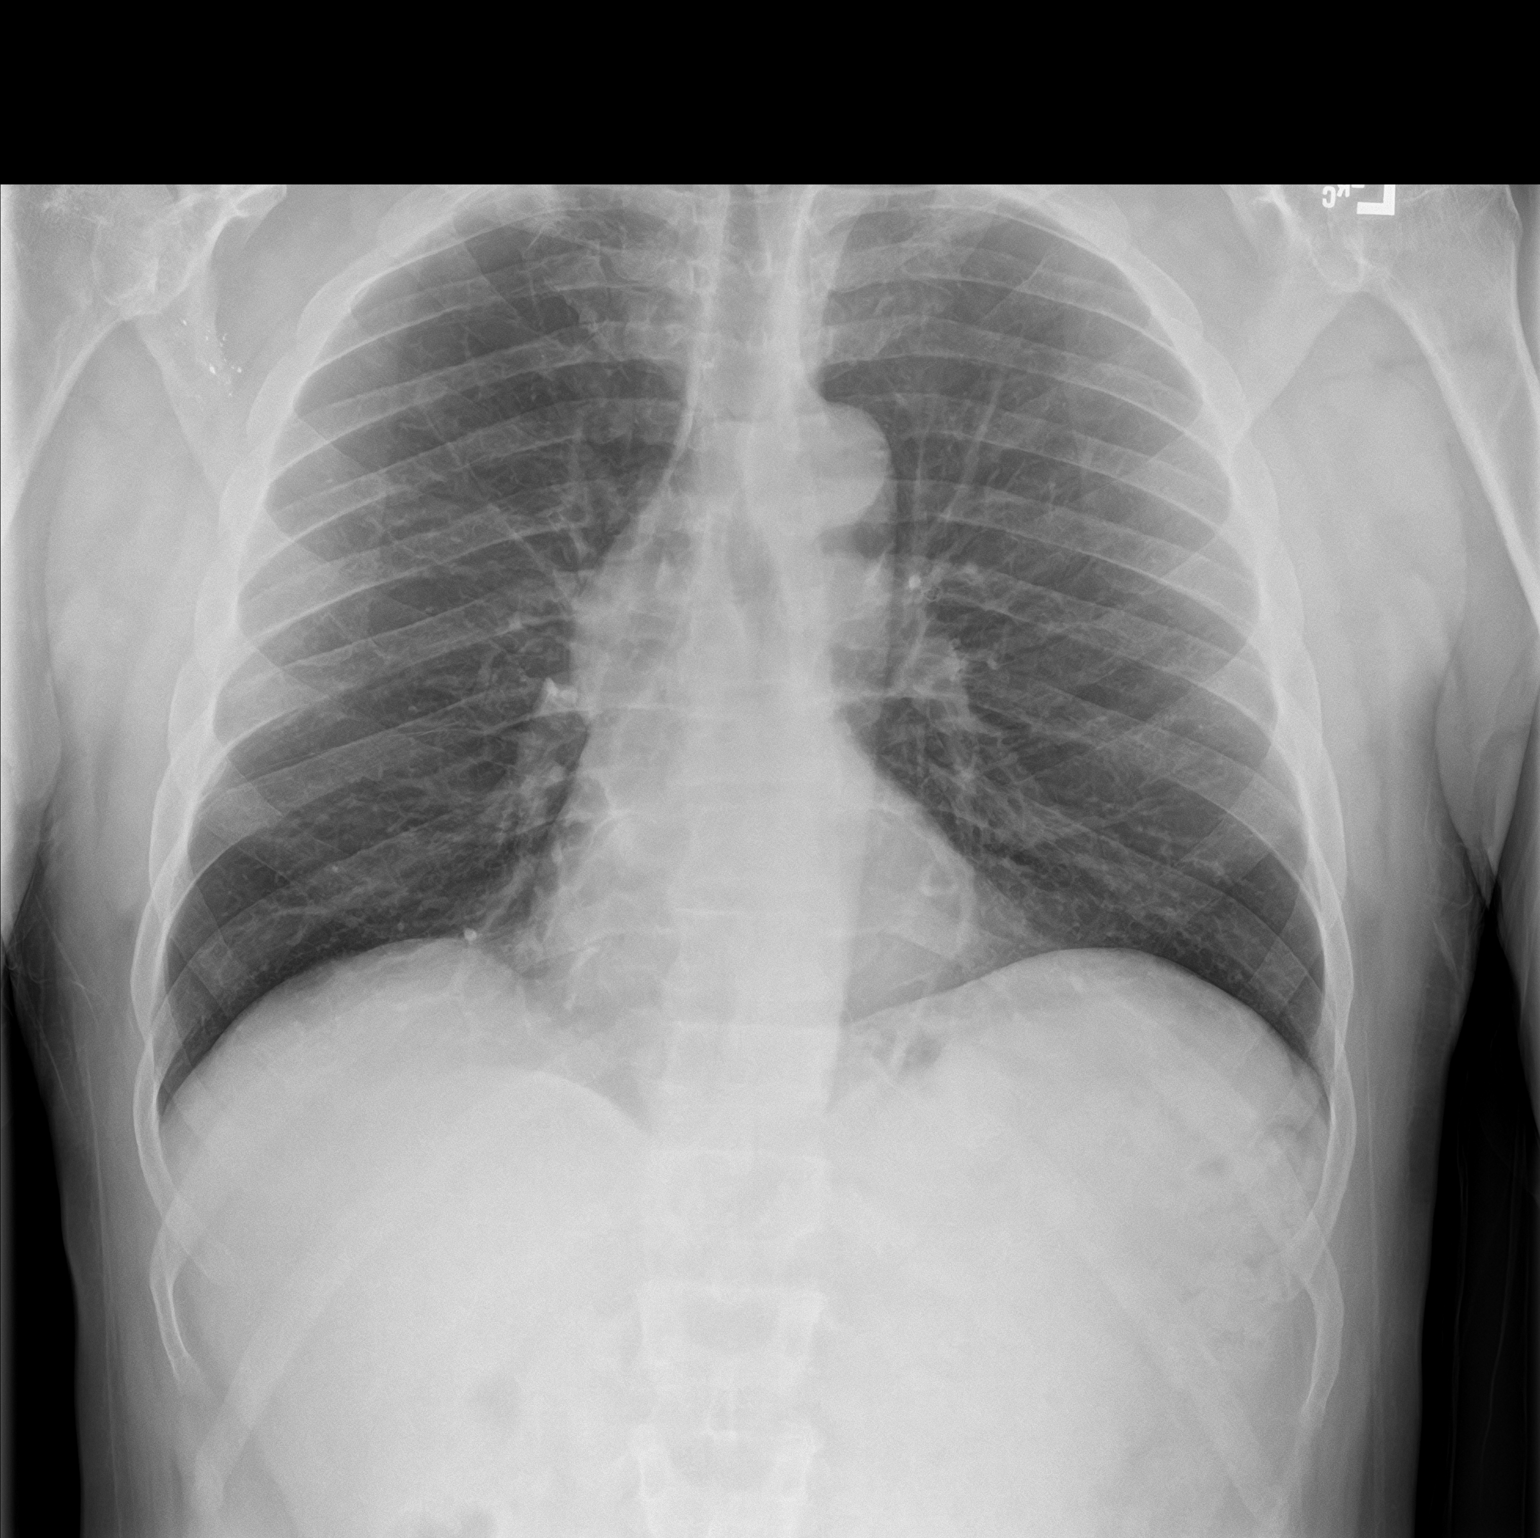
[im 2/2]
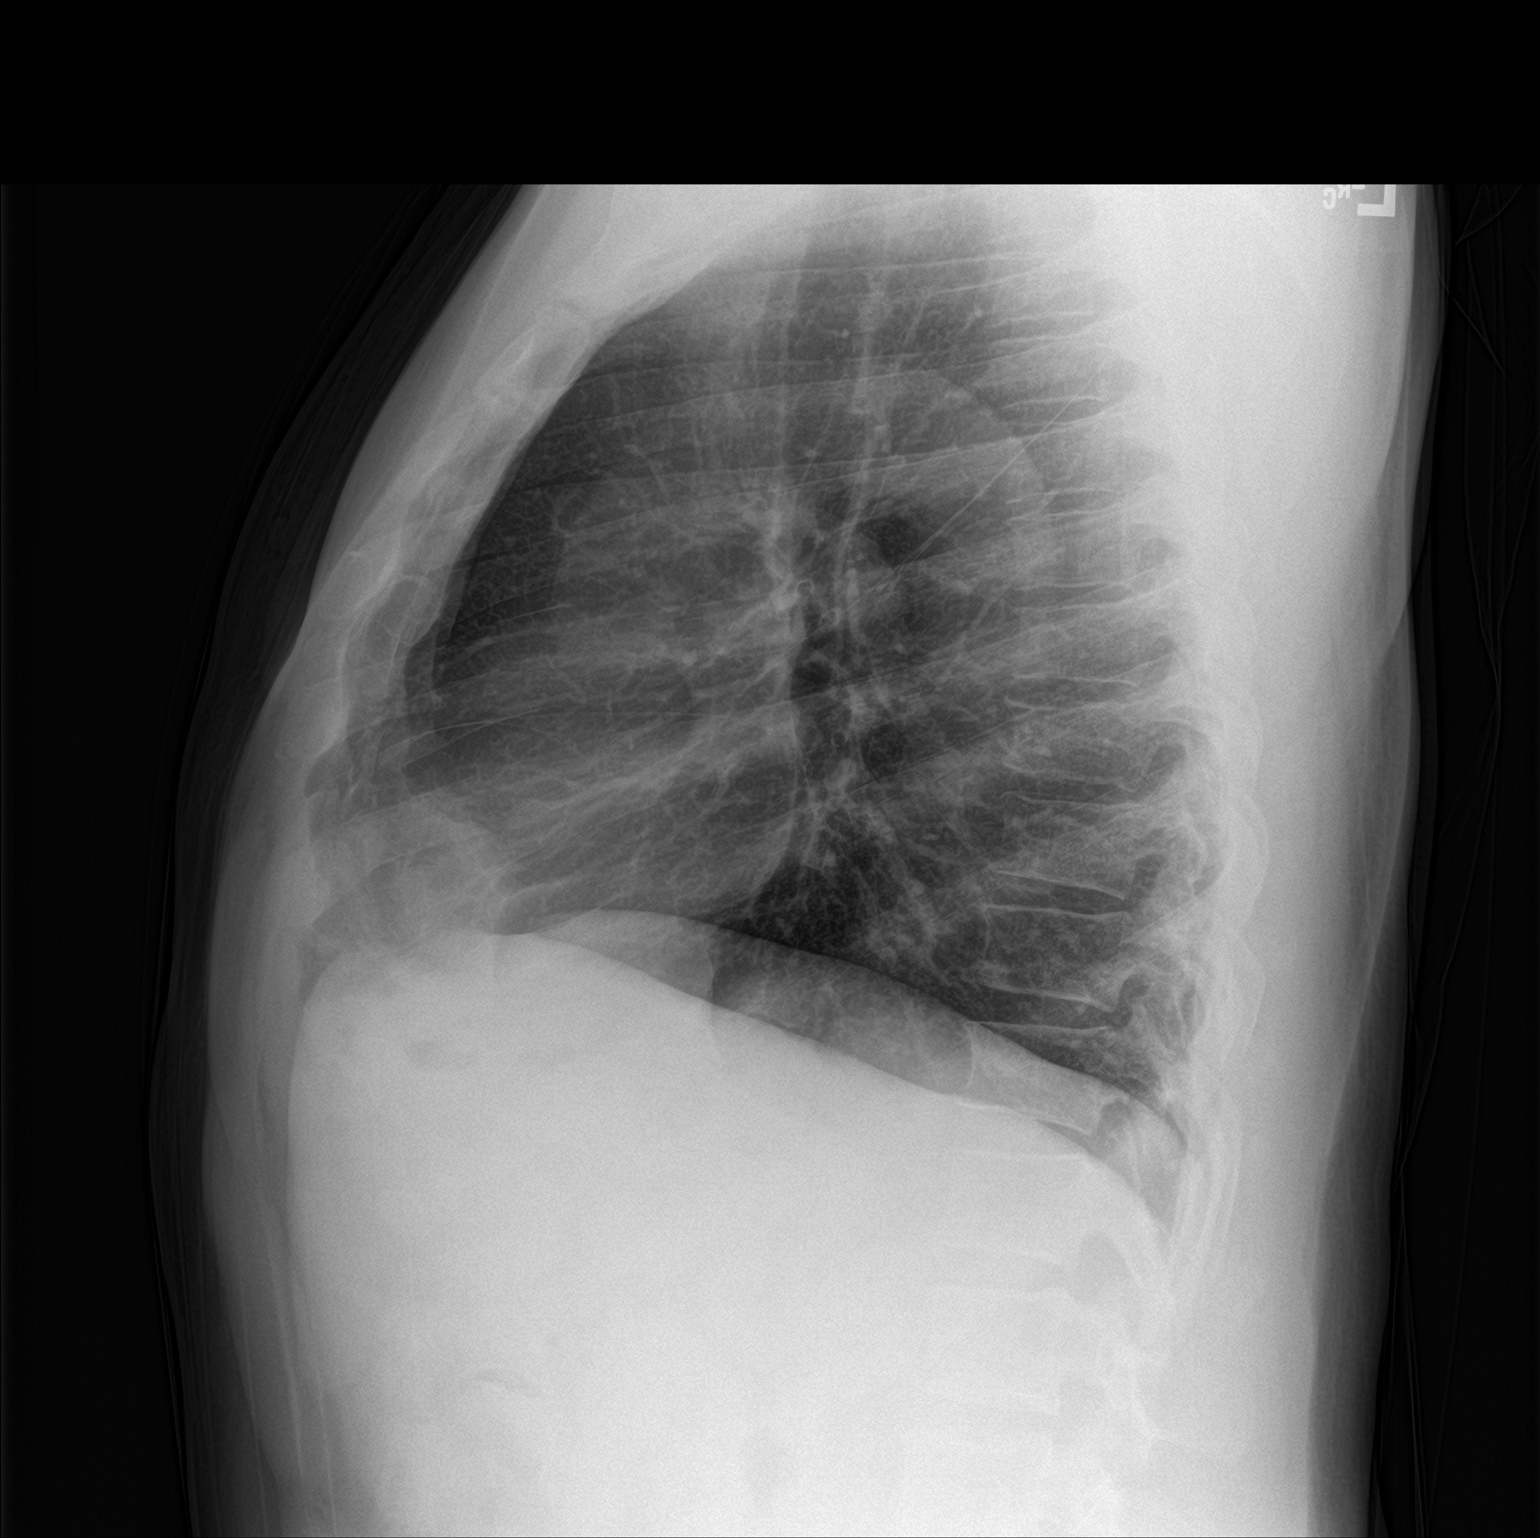

[2 of 2 positions shown; findings below may reference images not displayed]

FINDINGS: The lungs are clear without focal pneumonia, edema, pneumothorax or
pleural effusion. The cardiopericardial silhouette is within normal
limits for size. The visualized bony structures of the thorax show
no acute abnormality. Dystrophic calcification or clustered
radiopaque foreign bodies again identified superimposed on the right
axillary region, stable in the interval.
IMPRESSION: No active cardiopulmonary disease.

## 2021-11-27 LAB — EXTERNAL GENERIC LAB PROCEDURE: COLOGUARD: NEGATIVE

## 2022-04-10 DIAGNOSIS — I10 Essential (primary) hypertension: Secondary | ICD-10-CM | POA: Insufficient documentation

## 2022-04-28 DIAGNOSIS — F17200 Nicotine dependence, unspecified, uncomplicated: Secondary | ICD-10-CM | POA: Insufficient documentation

## 2022-06-15 ENCOUNTER — Ambulatory Visit
Admission: EM | Admit: 2022-06-15 | Discharge: 2022-06-15 | Disposition: A | Discharge status: Home / Self Care | Payer: Commercial Managed Care - PPO | Attending: Emergency Medicine | Admitting: Emergency Medicine

## 2022-06-15 ENCOUNTER — Encounter: Payer: Self-pay | Admitting: Emergency Medicine

## 2022-06-15 DIAGNOSIS — U071 COVID-19: Secondary | ICD-10-CM | POA: Diagnosis not present

## 2022-06-15 DIAGNOSIS — R059 Cough, unspecified: Secondary | ICD-10-CM

## 2022-06-15 DIAGNOSIS — R051 Acute cough: Secondary | ICD-10-CM

## 2022-06-15 DIAGNOSIS — Z113 Encounter for screening for infections with a predominantly sexual mode of transmission: Secondary | ICD-10-CM | POA: Diagnosis not present

## 2022-06-15 DIAGNOSIS — Z202 Contact with and (suspected) exposure to infections with a predominantly sexual mode of transmission: Secondary | ICD-10-CM | POA: Diagnosis not present

## 2022-06-15 LAB — HIV ANTIBODY (ROUTINE TESTING W REFLEX): HIV Screen 4th Generation wRfx: NONREACTIVE

## 2022-06-15 LAB — SARS CORONAVIRUS 2 BY RT PCR: SARS Coronavirus 2 by RT PCR: POSITIVE — AB

## 2022-06-15 NOTE — ED Provider Notes (Signed)
[facility]    CSN: [id number] Arrival date & time: 06/15/22  9528      History   Chief Complaint Chief Complaint  Patient presents with   Exposure to STD    HPI Ryan Gallegos is a 67 y.o. male.   Presents for evaluation after exposure to STD, unknown which infection as partner will not release information.  Denies all symptoms.  Patient presents for evaluation of an productive cough with yellow sputum for 1 to 2 weeks.  Known exposure to COVID.  Endorses symptoms are improving but he would still like to be checked.  Denies shortness of breath, wheezing, fevers.    History reviewed. No pertinent past medical history.  Patient Active Problem List   Diagnosis Date Noted   Hepatitis C virus infection cured after antiviral drug therapy 01/12/2019   Toe deformity 09/20/2017   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 No pertinent surgical history.     Home Medications    Prior to Admission medications   Medication Sig Start Date End Date Taking? Authorizing Provider  amLODipine (NORVASC) 5 MG tablet Take 1 tablet by mouth daily. 04/10/22 04/10/23 Yes [provider]  aspirin 81 MG EC tablet Take 1 tablet by mouth daily. 12/15/16  Yes [provider]  losartan (COZAAR) 100 MG tablet Take 1 tablet by mouth daily. 03/27/22 03/27/23 Yes [provider]  omeprazole (PRILOSEC) 20 MG capsule Take 20 mg by mouth daily. 09/24/20  Yes [provider]  sildenafil (VIAGRA) 100 MG tablet PLEASE SEE ATTACHED FOR DETAILED DIRECTIONS 12/27/20  Yes [provider]  diphenhydrAMINE (BENADRYL) 50 MG tablet Take 1 tablet (50 mg total) by mouth every 6 (six) hours as needed for itching or allergies. 03/07/21   Ward, Delice Bison, DO  ketoconazole  (NIZORAL) 2 % cream Apply 1 application topically daily. 01/15/21   McDonald, Stephan Minister, DPM  predniSONE (STERAPRED UNI-PAK 21 TAB) 10 MG (21) TBPK tablet Take as directed.  Start on 03/08/21. 03/07/21   Ward, Delice Bison, DO    Family History No family history on file.  Social History Social History   Tobacco Use   Smoking status: Former   Smokeless tobacco: Never  Scientific laboratory technician Use: Never used  Substance Use Topics   Alcohol use: No   Drug use: Not Currently    Types: Marijuana     Allergies   Patient has no known allergies.   Review of Systems Review of Systems  Constitutional: Negative.   HENT: Negative.    Respiratory:  Positive for cough. Negative for apnea, choking, chest tightness, shortness of breath, wheezing and stridor.   Cardiovascular: Negative.   Gastrointestinal: Negative.   Genitourinary: Negative.      Physical Exam Triage Vital Signs ED Triage Vitals  Enc Vitals Group     BP 06/15/22 0856 (!) 152/93     Pulse Rate 06/15/22 0856 82     Resp 06/15/22 0856 16     Temp 06/15/22 0856 98.8 F (37.1 C)     Temp Source 06/15/22 0856 Oral     SpO2 06/15/22 0856 98 %     Weight 06/15/22 0853 164 lb 14.5 oz (74.8 kg)     Height 06/15/22 0853 5\' 6"  (1.676 m)  Head Circumference --      Peak Flow --      Pain Score 06/15/22 0853 0     Pain Loc --      Pain Edu? --      Excl. in GC? --    No data found.  Updated Vital Signs BP (!) 152/93 (BP Location: Right Arm)   Pulse 82   Temp 98.8 F (37.1 C) (Oral)   Resp 16   Ht 5\' 6"  (1.676 m)   Wt 164 lb 14.5 oz (74.8 kg)   SpO2 98%   BMI 26.62 kg/m   Visual Acuity Right Eye Distance:   Left Eye Distance:   Bilateral Distance:    Right Eye Near:   Left Eye Near:    Bilateral Near:     Physical Exam Constitutional:      Appearance: Normal appearance.  Eyes:     Extraocular Movements: Extraocular movements intact.  Cardiovascular:     Rate and Rhythm: Normal rate and regular rhythm.      Pulses: Normal pulses.     Heart sounds: Normal heart sounds.  Pulmonary:     Effort: Pulmonary effort is normal.     Breath sounds: Normal breath sounds.  Genitourinary:    Comments: deferred Neurological:     Mental Status: He is alert and oriented to person, place, and time.      UC Treatments / Results  Labs (all labs ordered are listed, but only abnormal results are displayed) Labs Reviewed  HIV ANTIBODY (ROUTINE TESTING W REFLEX)  RPR  CYTOLOGY, (ORAL, ANAL, URETHRAL) ANCILLARY ONLY    EKG   Radiology No results found.  Procedures Procedures (including critical care time)  Medications Ordered in UC Medications - No data to display  Initial Impression / Assessment and Plan / UC Course  I have reviewed the triage vital signs and the nursing notes.  Pertinent labs & imaging results that were available during my care of the patient were reviewed by me and considered in my medical decision making (see chart for details).  Routine screening for STI, exposure to STI, acute cough   STI labs pending will treat per protocol, advised abstinence until lab results, and/or treatment is complete, advised condom use during all sexual encounters moving, may follow-up with urgent care as needed   Vitals are stable, O2 saturation 98% on room air, lungs clear to auscultation, low suspicion for pneumonia or bronchitis therefore imaging deferred, COVID test pending, discussed past timeline for antivirals and quarantine, may use over-the-counter medications for management of cough with urgent care follow-up as needed, work note given Final Clinical Impressions(s) / UC Diagnoses   Final diagnoses:  None     Discharge Instructions      Labs pending , you will be contacted if positive for any sti and treatment will be sent to the pharmacy, you will have to return to the clinic if positive for gonorrhea to receive treatment   Please refrain from having sex until labs results, if  positive please refrain from having sex until treatment complete and symptoms resolve   If positive for HIV, Syphilis, Chlamydia  gonorrhea or trichomoniasis please notify partner or partners so they may tested as well  Moving forward, it is recommended you use some form of protection against the transmission of sti infections  such as condoms or dental dams with each sexual encounter     ED Prescriptions   None    PDMP not reviewed this  encounter.   Hans [place], [place] 06/15/22 [phone]

## 2022-06-15 NOTE — ED Triage Notes (Signed)
Pt presents to [facility] for multiple complaints. He states his girlfriend told him she had an STD, did not tell him which STD. Denies symptoms of STD. He also states he wants a covid test. He states he was sick 2 weeks ago. His symptoms have resolved. He states he is has lower back pain. This has also resolved.

## 2022-06-15 NOTE — Discharge Instructions (Addendum)
COVID test is pending up to 24 hours, you will be notified of positive test results only, as you have been coughing for greater than 1 week you will not need to quarantine and you may use any of the over-the-counter medications to help reduce your symptoms until they resolve  Your symptoms today are most likely being caused by a virus and should steadily improve in time it can take up to 7 to 10 days before you truly start to see a turnaround however things will get better  For cough: honey 1/2 to 1 teaspoon (you can dilute the honey in water or another fluid).  You can also use guaifenesin and dextromethorphan for cough. You can use a humidifier for chest congestion and cough.  If you don't have a humidifier, you can sit in the bathroom with the hot shower running.     For congestion: take a daily anti-histamine like Zyrtec, Claritin, and a oral decongestant, such as pseudoephedrine.  You can also use Flonase 1-2 sprays in each nostril daily.   It is important to stay hydrated: drink plenty of fluids (water, gatorade/powerade/pedialyte, juices, or teas) to keep your throat moisturized and help further relieve irritation/discomfort.    Labs pending , you will be contacted if positive for any sti and treatment will be sent to the pharmacy, you will have to return to the clinic if positive for gonorrhea to receive treatment   Please refrain from having sex until labs results, if positive please refrain from having sex until treatment complete and symptoms resolve   If positive for HIV, Syphilis, Chlamydia  gonorrhea or trichomoniasis please notify partner or partners so they may tested as well  Moving forward, it is recommended you use some form of protection against the transmission of sti infections  such as condoms or dental dams with each sexual encounter

## 2022-06-16 ENCOUNTER — Telehealth (HOSPITAL_COMMUNITY): Payer: Self-pay | Admitting: Emergency Medicine

## 2022-06-16 LAB — CYTOLOGY, (ORAL, ANAL, URETHRAL) ANCILLARY ONLY
Chlamydia: NEGATIVE
Comment: NEGATIVE
Comment: NEGATIVE
Comment: NORMAL
Neisseria Gonorrhea: NEGATIVE
Trichomonas: POSITIVE — AB

## 2022-06-16 LAB — RPR: RPR Ser Ql: NONREACTIVE

## 2022-06-16 MED ORDER — METRONIDAZOLE 500 MG PO TABS: 2000.0000 mg | ORAL_TABLET | Freq: Once | ORAL | 0 refills | Status: AC

## 2022-07-01 ENCOUNTER — Emergency Department: Payer: Commercial Managed Care - PPO

## 2022-07-01 ENCOUNTER — Emergency Department
Admission: EM | Admit: 2022-07-01 | Discharge: 2022-07-01 | Disposition: A | Discharge status: Home / Self Care | Payer: Commercial Managed Care - PPO | Attending: Emergency Medicine | Admitting: Emergency Medicine

## 2022-07-01 ENCOUNTER — Other Ambulatory Visit: Payer: Self-pay

## 2022-07-01 DIAGNOSIS — M25511 Pain in right shoulder: Secondary | ICD-10-CM | POA: Diagnosis not present

## 2022-07-01 DIAGNOSIS — X500XXA Overexertion from strenuous movement or load, initial encounter: Secondary | ICD-10-CM | POA: Insufficient documentation

## 2022-07-01 MED ORDER — NAPROXEN 500 MG PO TABS: 500.0000 mg | ORAL_TABLET | Freq: Two times a day (BID) | ORAL | 0 refills | Status: AC

## 2022-07-01 MED ORDER — KETOROLAC TROMETHAMINE 15 MG/ML IJ SOLN
15.0000 mg | Freq: Once | INTRAMUSCULAR | Status: AC
  Administered 2022-07-01: 15 mg via INTRAMUSCULAR
  Filled 2022-07-01: qty 1

## 2022-07-01 NOTE — ED Provider Notes (Signed)
[facility] Provider Note    Event Date/Time   First MD Initiated Contact with Patient 07/01/22 1155     (approximate)   History   Arm Injury   HPI  Ryan Gallegos is a 67 y.o. male with no reported past medical history presents today for evaluation of right shoulder pain.  Patient reports that he has been lifting heavy objects at work and thinks that he may have strained his shoulder.  He reports that he has had pain for approximately 1 week.  He reports that his pain is worse when reaching overhead or pulling things towards him.  He has not had any chest pain or shortness of breath.  No fevers or chills.  He is still able to move his shoulder normally.  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      Excl. in [place]?     Most recent vital signs: Vitals:   07/01/22 1117  BP: (!) 140/104  Pulse: 80  Resp: 18  Temp: 98 F (36.7 C)  SpO2: 99%    Physical Exam Vitals and nursing note reviewed.  Constitutional:      General: Awake and alert. No acute distress.    Appearance: Normal appearance. The patient is normal weight.  HENT:     Head: Normocephalic and atraumatic.     Mouth: Mucous membranes are moist.  Eyes:     General: PERRL. Normal EOMs        Right eye: No discharge.        Left eye: No  discharge.     Conjunctiva/sclera: Conjunctivae normal.  Cardiovascular:     Rate and Rhythm: Normal rate and regular rhythm.     Pulses: Normal pulses.  Pulmonary:     Effort: Pulmonary effort is normal. No respiratory distress.     Breath sounds: Normal breath sounds.  Abdominal:     Abdomen is soft. There is no abdominal tenderness. No rebound or guarding. No distention. Musculoskeletal:        General: No swelling. Normal range of motion.     Cervical back: Normal range of motion and neck supple.  Right upper extremity: No obvious deformity, swelling, ecchymosis, or erythema No clavicular or AC joint tenderness Able to actively and passively forward flex and abduct at shoulder fully, negative drop arm test Pain with Obriens, SLAP, empty can, and lift off tests Normal internal and external rotation against resistance Pain with Hawkins and Neers Normal ROM at elbow and wrist  Normal resisted pronation and supination 2+ radial pulse Normal grip strength Normal intrinsic hand muscle function Skin:    General: Skin is warm and dry.     Capillary Refill: Capillary refill takes less than 2 seconds.     Findings: No rash.  Neurological:     Mental Status: The patient is awake and alert.      ED Results / Procedures / Treatments   Labs (all labs ordered are listed, but only abnormal results are displayed) Labs Reviewed - No data to display   EKG     RADIOLOGY I independently reviewed and interpreted imaging and agree with radiologists findings.     PROCEDURES:  Critical Care performed:   Procedures   MEDICATIONS ORDERED IN ED: Medications  ketorolac (TORADOL) 15 MG/ML injection 15 mg (15 mg Intramuscular Given 07/01/22 1212)     IMPRESSION / MDM / ASSESSMENT AND PLAN / ED COURSE  I reviewed the triage vital signs and the nursing notes.   Differential diagnosis includes, but is not limited to, rotator cuff injury, fracture, dislocation, sprain, contusion.   Patient is awake and alert, hemodynamically stable and afebrile.  There is no deformity to his shoulder.  There is no Popeye deformity.  He has full active and passive range of motion of his shoulder, though has pain specifically with reaching overhead.  Negative drop arm test, I do not suspect complete rotator cuff tear.  He has pain with SLAP test, O'Brien's test, empty can test, Neer's test, and Hawkins.  I suspect some element of rotator cuff injury or impingement syndrome, though I do not suspect a complete rotator cuff tear.  He was given analgesia in the emergency department with improvement of his symptoms.  He was given a work note and instructed not to reach overhead or do any heavy lifting.  He was instructed to follow-up with orthopedics for further workup and management.  The appropriate information was provided.  We discussed return precautions.  Patient understands and agrees with plan.  He was discharged in stable condition.   Patient's presentation is most consistent with acute complicated illness / injury requiring diagnostic workup.     FINAL CLINICAL IMPRESSION(S) / ED DIAGNOSES   Final diagnoses:  Acute pain of right shoulder     Rx / DC Orders   ED Discharge Orders          Ordered    naproxen (NAPROSYN) 500 MG tablet  2 times daily with meals        07/01/22 1208             Note:  This document was prepared using Dragon voice recognition software and may include unintentional dictation errors.   Emeline Gins 07/01/22 1321    Vanessa , MD 07/02/22 [phone]

## 2022-07-01 NOTE — ED Triage Notes (Signed)
First Nurse Note:  C/O right shoulder/ upper back pain.  AAOx3.  Skin warm and dry. NAD

## 2022-07-01 NOTE — ED Triage Notes (Signed)
Pt comes with c/o right shoulder pain. Pt states this started few days ago. Pt states he isn't sure if he just overworked it at work. Pt does a lot of lifting. Pt states no relief with OTC meds.

## 2022-07-01 NOTE — Discharge Instructions (Signed)
Take the medication as prescribed and as needed for pain.  Please follow-up with orthopedics.  Please return for any new, worsening, or changing symptoms or other concerns.  Please refrain from heavy lifting or reaching overhead.  It was a pleasure caring for you today.

## 2022-11-11 ENCOUNTER — Ambulatory Visit
Admission: EM | Admit: 2022-11-11 | Discharge: 2022-11-11 | Disposition: A | Discharge status: Home / Self Care | Payer: Commercial Managed Care - PPO

## 2022-11-11 DIAGNOSIS — L720 Epidermal cyst: Secondary | ICD-10-CM

## 2022-11-11 HISTORY — DX: Asymptomatic varicose veins of unspecified lower extremity: I83.90

## 2022-11-11 HISTORY — DX: Esophagitis, unspecified without bleeding: K20.90

## 2022-11-11 HISTORY — DX: Male erectile dysfunction, unspecified: N52.9

## 2022-11-11 HISTORY — DX: Essential (primary) hypertension: I10

## 2022-11-11 NOTE — ED Triage Notes (Signed)
Pt c/o boil near L eye x1 mon. Denies any drainage or pain.

## 2022-11-11 NOTE — Discharge Instructions (Signed)
-  I was able to get some of the cyst material out but not all of it  -At this time, you should use warm compresses and try to push the rest out over the next few days -Follow up with PCP or dermatology if it is still bothering you

## 2022-11-11 NOTE — ED Provider Notes (Signed)
[facility]    CSN: [id number] Arrival date & time: 11/11/22  1430      History   Chief Complaint Chief Complaint  Patient presents with   Boil    HPI Ryan Gallegos is a 67 y.o. male presenting for 1 month history of an area of swelling near the left eye.  He says it is a cyst and he has gotten some pus out.  He denies any associated pain, redness.  Reports he is going on a cruise soon and he wants it to be gone.  Reports that he has had cysts like this in other places.  Denies any history of MRSA.  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 No pertinent surgical history.     Home Medications    Prior to Admission medications   Medication Sig Start Date End Date Taking? Authorizing Provider  amLODipine (NORVASC) 5 MG tablet Take 1 tablet by mouth daily. 04/10/22 04/10/23 Yes [provider]  aspirin 81 MG EC tablet Take 1 tablet by mouth daily. 12/15/16  Yes [provider]  atorvastatin (LIPITOR) 40 MG tablet Take 40 mg by mouth daily.   Yes [provider]  diphenhydrAMINE (BENADRYL) 50 MG tablet Take 1 tablet (50 mg total) by mouth every 6 (six) hours as needed for itching or allergies. 03/07/21  Yes Ward, Layla Maw, DO  ketoconazole (NIZORAL) 2 % cream Apply 1 application topically daily. 01/15/21  Yes McDonald, Rachelle Hora, DPM  losartan (COZAAR) 100 MG tablet Take 1 tablet by mouth daily.  03/27/22 03/27/23 Yes [provider]  omeprazole (PRILOSEC) 20 MG capsule Take 20 mg by mouth daily. 09/24/20  Yes [provider]  predniSONE (STERAPRED UNI-PAK 21 TAB) 10 MG (21) TBPK tablet Take as directed.  Start on 03/08/21. 03/07/21  Yes Ward, Layla Maw, DO  sildenafil (VIAGRA) 100 MG tablet PLEASE SEE ATTACHED FOR DETAILED DIRECTIONS 12/27/20  Yes [provider]    Family History History reviewed. No pertinent family history.  Social History Social History   Tobacco Use   Smoking status: Former   Smokeless tobacco: Never  Building services engineer Use: Never used  Substance Use Topics   Alcohol use: No   Drug use: Not Currently    Types: Marijuana     Allergies   Patient has no known allergies.   Review of Systems Review of Systems  Constitutional:  Negative for fatigue and fever.  HENT:  Negative for facial swelling.   Eyes:  Negative for photophobia, pain, discharge, redness, itching and visual disturbance.  Skin:  Negative for color change and wound.       +swollen lump on face     Physical Exam Triage Vital Signs ED Triage Vitals  Enc Vitals Group     BP      Pulse      Resp  Temp      Temp src      SpO2      Weight      Height      Head Circumference      Peak Flow      Pain Score      Pain Loc      Pain Edu?      Excl. in [place]?    No data found.  Updated Vital Signs BP 130/86 (BP Location: Left Arm)   Pulse 82   Temp 98.5 F (36.9 C) (Oral)   Resp 16   Ht 5\' 6"  (1.676 m)   Wt 167 lb (75.8 kg)   SpO2 97%   BMI 26.95 kg/m      Physical Exam Vitals and nursing note reviewed.  Constitutional:      General: He is not in acute distress.    Appearance: Normal appearance. He is well-developed. He is not ill-appearing.  HENT:     Head: Normocephalic and atraumatic.  Eyes:     General: No scleral icterus.    Conjunctiva/sclera: Conjunctivae normal.  Cardiovascular:     Rate and Rhythm: Normal rate.   Pulmonary:     Effort: Pulmonary effort is normal. No respiratory distress.  Musculoskeletal:     Cervical back: Neck supple.  Skin:    General: Skin is warm and dry.     Capillary Refill: Capillary refill takes less than 2 seconds.     Comments: Small area of swelling consistent with cyst that is about 1 cm in diameter adjacent to the left eye. Non tender.  Neurological:     Mental Status: He is alert.  Psychiatric:        Mood and Affect: Mood normal.        Behavior: Behavior normal.      UC Treatments / Results  Labs (all labs ordered are listed, but only abnormal results are displayed) Labs Reviewed - No data to display  EKG   Radiology No results found.  Procedures Incision and Drainage  Date/Time: 11/11/2022 3:16 PM  Performed by: Shirlee Latch, PA-C Authorized by: Shirlee Latch, PA-C   Consent:    Consent obtained:  Verbal   Consent given by:  Patient   Risks discussed:  Bleeding, incomplete drainage, pain and infection   Alternatives discussed:  No treatment, delayed treatment and referral Universal protocol:    Patient identity confirmed:  Verbally with patient Location:    Type:  Cyst   Size:  1 cm Pre-procedure details:    Skin preparation:  Chlorhexidine with alcohol Anesthesia:    Anesthesia method:  Local infiltration   Local anesthetic:  Lidocaine 1% w/o epi Procedure type:    Complexity:  Simple Procedure details:    Incision types:  Single straight   Drainage:  Purulent   Drainage amount:  Scant   Wound treatment:  Wound left open Post-procedure details:    Procedure completion:  Tolerated well, no immediate complications Comments:     Used scalpel to make small incision. Able to express minimal amount of thick white material.   (including critical care time)  Medications Ordered in UC Medications - No data to display  Initial Impression / Assessment and Plan / UC Course  I have reviewed the triage vital signs and the nursing  notes.  Pertinent labs & imaging results that were available during my care of the patient were reviewed by me and considered in my medical decision making (see  chart for details).   67 year old male presents for cyst near the left eye that is been present for about a month.  He was able to get some pus out but not all of it.  He requests incision and drainage today.  After consent obtained, clean the area with chlorhexidine and alcohol.  Used 11 blade scalpel to make a small incision.  Able to express small amount of thick white material.  There continues to be a small amount of swelling.  Advised patient to continue to keep the area clean and apply warm compresses.  Explained that he may build to push out more of the thick white material.  Advised that if he is still bothered by this and it remains after trying this he should follow-up with dermatology or PCP.   Final Clinical Impressions(s) / UC Diagnoses   Final diagnoses:  Epidermal cyst of face     Discharge Instructions      -I was able to get some of the cyst material out but not all of it  -At this time, you should use warm compresses and try to push the rest out over the next few days -Follow up with PCP or dermatology if it is still bothering you     ED Prescriptions   None    PDMP not reviewed this encounter.   Shirlee Latch, PA-C 11/11/22 [phone]

## 2022-12-23 ENCOUNTER — Ambulatory Visit
Admission: EM | Admit: 2022-12-23 | Discharge: 2022-12-23 | Disposition: A | Discharge status: Home / Self Care | Payer: Commercial Managed Care - PPO | Attending: Family Medicine | Admitting: Family Medicine

## 2022-12-23 DIAGNOSIS — U071 COVID-19: Secondary | ICD-10-CM | POA: Insufficient documentation

## 2022-12-23 DIAGNOSIS — J020 Streptococcal pharyngitis: Secondary | ICD-10-CM | POA: Diagnosis present

## 2022-12-23 LAB — GROUP A STREP BY PCR: Group A Strep by PCR: DETECTED — AB

## 2022-12-23 LAB — SARS CORONAVIRUS 2 BY RT PCR: SARS Coronavirus 2 by RT PCR: NEGATIVE

## 2022-12-23 MED ORDER — PENICILLIN G BENZATHINE 1200000 UNIT/2ML IM SUSY
1.2000 10*6.[IU] | PREFILLED_SYRINGE | Freq: Once | INTRAMUSCULAR | Status: AC
  Administered 2022-12-23: 1.2 10*6.[IU] via INTRAMUSCULAR

## 2022-12-23 MED ORDER — AMOXICILLIN 500 MG PO CAPS: 1000.0000 mg | ORAL_CAPSULE | Freq: Every day | ORAL | 0 refills | Status: DC

## 2022-12-23 NOTE — ED Provider Notes (Signed)
[facility]    CSN: [id number] Arrival date & time: 12/23/22  1446      History   Chief Complaint Chief Complaint  Patient presents with   Sore Throat    HPI Ryan Gallegos is a 67 y.o. male.   HPI  History obtained from the patient. Gregoire presents for sore throat for the past 3-4 nights. He has phelgm in his throat and then he coughs it up. Has a bad taste in his mouth.  He recently returned from a cruise about 2 weeks ago but was feeling okay.  He ate some fresh oystered during his cruise and felt fine after eating them. No new toothpaste, dental products or medications.   Gargled with Listerine and peroxide which helped somewhat. He has been sleeping under a ceiling fan which he doesn't normally do.    Fever : no  Chills: no Sore throat: yes Cough: just in the mornings  Sputum: no Chest tightness: no Shortness of breath: no Wheezing: no  Nasal congestion: in the mornings  Rhinorrhea: no Myalgias: no Appetite: normal  Hydration: normal  Abdominal pain: no Nausea: no Vomiting: no Diarrhea: No Rash: No Sleep disturbance: no Headache: no  Back pain: no      Past Medical History:  Diagnosis Date   Erectile dysfunction    Esophagitis    Primary hypertension    Varicose vein of leg     Patient Active Problem List   Diagnosis Date Noted   Current smoker 04/28/2022   Primary hypertension 04/10/2022   Hepatitis C virus infection cured after antiviral drug therapy 01/12/2019   Toe deformity 09/20/2017   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 No pertinent surgical history.     Home Medications    Prior to Admission medications   Medication Sig Start Date End Date Taking? Authorizing Provider  amLODipine (NORVASC) 5 MG tablet Take 1 tablet  by mouth daily. 04/10/22 04/10/23 Yes [provider]  aspirin 81 MG EC tablet Take 1 tablet by mouth daily. 12/15/16  Yes [provider]  atorvastatin (LIPITOR) 40 MG tablet Take 40 mg by mouth daily.   Yes [provider]  diphenhydrAMINE (BENADRYL) 50 MG tablet Take 1 tablet (50 mg total) by mouth every 6 (six) hours as needed for itching or allergies. 03/07/21  Yes Ward, Layla Maw, DO  ketoconazole (NIZORAL) 2 % cream Apply 1 application topically daily. 01/15/21  Yes McDonald, Rachelle Hora, DPM  losartan (COZAAR) 100 MG tablet Take 1 tablet by mouth daily. 03/27/22 03/27/23 Yes [provider]  omeprazole (PRILOSEC) 20 MG capsule Take 20 mg by mouth daily. 09/24/20  Yes [provider]  predniSONE (STERAPRED UNI-PAK 21 TAB) 10 MG (21) TBPK tablet Take as directed.  Start on 03/08/21. 03/07/21  Yes Ward, Layla Maw, DO  sildenafil (VIAGRA) 100 MG tablet PLEASE SEE ATTACHED FOR DETAILED DIRECTIONS 12/27/20  Yes [provider]    Family History History reviewed. No pertinent family history.  Social History Social History   Tobacco Use   Smoking status: Former   Smokeless tobacco: Never  Advertising account planner   Vaping status: Never Used  Substance Use Topics   Alcohol use: No   Drug use: Not Currently    Types: Marijuana     Allergies  Patient has no known allergies.   Review of Systems Review of Systems: negative unless otherwise stated in HPI.      Physical Exam Triage Vital Signs ED Triage Vitals  Encounter Vitals Group     BP 12/23/22 1457 125/85     Systolic BP Percentile --      Diastolic BP Percentile --      Pulse Rate 12/23/22 1457 81     Resp 12/23/22 1457 16     Temp 12/23/22 1457 98.7 F (37.1 C)     Temp Source 12/23/22 1457 Oral     SpO2 12/23/22 1457 95 %     Weight 12/23/22 1457 174 lb (78.9 kg)     Height 12/23/22 1457 5\' 6"  (1.676 m)     Head Circumference --      Peak Flow --      Pain Score 12/23/22 1501 0      Pain Loc --      Pain Education --      Exclude from Growth Chart --    No data found.  Updated Vital Signs BP 125/85 (BP Location: Right Arm)   Pulse 81   Temp 98.7 F (37.1 C) (Oral)   Resp 16   Ht 5\' 6"  (1.676 m)   Wt 78.9 kg   SpO2 95%   BMI 28.08 kg/m   Visual Acuity Right Eye Distance:   Left Eye Distance:   Bilateral Distance:    Right Eye Near:   Left Eye Near:    Bilateral Near:     Physical Exam GEN:     alert, il but non-toxic appearing male in no distress    HENT:  mucus membranes moist, oropharyngeal without lesions, erythematous tonsils that are 2+ without exudates visible, no nasal discharge, bilateral TM normal EYES:   pupils equal and reactive, no scleral injection or discharge NECK:  normal ROM, bilateral lymphadenopathy, no meningismus   RESP:  no increased work of breathing, clear to auscultation bilaterally CVS:   regular rate and rhythm Skin:   warm and dry, no rash on visible skin    UC Treatments / Results  Labs (all labs ordered are listed, but only abnormal results are displayed) Labs Reviewed  GROUP A STREP BY PCR - Abnormal; Notable for the following components:      Result Value   Group A Strep by PCR DETECTED (*)    All other components within normal limits  SARS CORONAVIRUS 2 BY RT PCR    EKG   Radiology No results found.  Procedures Procedures (including critical care time)  Medications Ordered in UC Medications  penicillin g benzathine (BICILLIN LA) 1200000 UNIT/2ML injection 1.2 Million Units (1.2 Million Units Intramuscular Given 12/23/22 1614)    Initial Impression / Assessment and Plan / UC Course  I have reviewed the triage vital signs and the nursing notes.  Pertinent labs & imaging results that were available during my care of the patient were reviewed by me and considered in my medical decision making (see chart for details).      Strep pharyngitis Patient is a 67 y.o. male who presents for sore throat for  the past 4.  On exam, pharyngeal exam is erythematous and enlarged tonsils but no exudates.  Strep test is positive. Overall patient is non-toxic-appearing, well-hydrated and without respiratory distress. Pt is afebrile here.  Tylenol/Motrin as needed for discomfort.  Continue gargling with warm salt water.  Recommended to avoid anything that irritates histhroat.  Discussed treatment with amoxicillin versus IM treatment and patient prefers IM treatment because he does not like taking medications.  Bicillin IM ordered and given.  Stressed the importance of hydration.  Work note provided, as needed.  Discussed MDM, treatment plan and plan for follow-up with patient who agrees with plan.      Final Clinical Impressions(s) / UC Diagnoses   Final diagnoses:  Strep pharyngitis     Discharge Instructions      You can take Tylenol and/or Ibuprofen as needed for fever reduction and pain relief.    For sore throat: try warm salt water gargles, Mucinex sore throat cough drops or cepacol lozenges, throat spray, warm tea or water with lemon/honey, popsicles or ice, or OTC cold relief medicine for throat discomfort. You can also purchase chloraseptic spray at the pharmacy or dollar store.  It is important to stay hydrated: drink plenty of fluids (water, gatorade/powerade/pedialyte, juices, or teas) to keep your throat moisturized and help further relieve irritation/discomfort.    Return or go to the Emergency Department if symptoms worsen or do not improve in the next few days      ED Prescriptions     Medication Sig Dispense Auth. Provider      PDMP not reviewed this encounter.   Katha Cabal, DO 12/26/22 [phone]

## 2022-12-23 NOTE — ED Triage Notes (Signed)
Pt c/o sore throat & cough x4 days. Denies any fevers. Has tried rinsing mouth with peroxide w/o relief.

## 2022-12-23 NOTE — Discharge Instructions (Signed)
You can take Tylenol and/or Ibuprofen as needed for fever reduction and pain relief.    For sore throat: try warm salt water gargles, Mucinex sore throat cough drops or cepacol lozenges, throat spray, warm tea or water with lemon/honey, popsicles or ice, or OTC cold relief medicine for throat discomfort. You can also purchase chloraseptic spray at the pharmacy or dollar store.  It is important to stay hydrated: drink plenty of fluids (water, gatorade/powerade/pedialyte, juices, or teas) to keep your throat moisturized and help further relieve irritation/discomfort.    Return or go to the Emergency Department if symptoms worsen or do not improve in the next few days

## 2023-01-31 ENCOUNTER — Emergency Department: Payer: Commercial Managed Care - PPO

## 2023-01-31 ENCOUNTER — Other Ambulatory Visit: Payer: Self-pay

## 2023-01-31 ENCOUNTER — Emergency Department
Admission: EM | Admit: 2023-01-31 | Discharge: 2023-01-31 | Disposition: A | Discharge status: Home / Self Care | Payer: Commercial Managed Care - PPO | Attending: Emergency Medicine | Admitting: Emergency Medicine

## 2023-01-31 DIAGNOSIS — R600 Localized edema: Secondary | ICD-10-CM

## 2023-01-31 DIAGNOSIS — R2242 Localized swelling, mass and lump, left lower limb: Secondary | ICD-10-CM | POA: Diagnosis present

## 2023-01-31 LAB — CBC
HCT: 39.2 % (ref 39.0–52.0)
Hemoglobin: 12.5 g/dL — ABNORMAL LOW (ref 13.0–17.0)
MCH: 26.8 pg (ref 26.0–34.0)
MCHC: 31.9 g/dL (ref 30.0–36.0)
MCV: 83.9 fL (ref 80.0–100.0)
Platelets: 236 10*3/uL (ref 150–400)
RBC: 4.67 MIL/uL (ref 4.22–5.81)
RDW: 13.1 % (ref 11.5–15.5)
WBC: 6.4 10*3/uL (ref 4.0–10.5)
nRBC: 0 % (ref 0.0–0.2)

## 2023-01-31 LAB — COMPREHENSIVE METABOLIC PANEL
ALT: 25 U/L (ref 0–44)
AST: 26 U/L (ref 15–41)
Albumin: 3.7 g/dL (ref 3.5–5.0)
Alkaline Phosphatase: 55 U/L (ref 38–126)
Anion gap: 5 (ref 5–15)
BUN: 14 mg/dL (ref 8–23)
CO2: 25 mmol/L (ref 22–32)
Calcium: 9.6 mg/dL (ref 8.9–10.3)
Chloride: 109 mmol/L (ref 98–111)
Creatinine, Ser: 0.96 mg/dL (ref 0.61–1.24)
GFR, Estimated: >60 mL/min (ref >60)
Glucose, Bld: 130 mg/dL — ABNORMAL HIGH (ref 70–99)
Potassium: 3.5 mmol/L (ref 3.5–5.1)
Sodium: 139 mmol/L (ref 135–145)
Total Bilirubin: 0.6 mg/dL (ref 0.3–1.2)
Total Protein: 7.1 g/dL (ref 6.5–8.1)

## 2023-01-31 LAB — D-DIMER, QUANTITATIVE: D-Dimer, Quant: 0.9 ug{FEU}/mL — ABNORMAL HIGH (ref 0.00–0.50)

## 2023-01-31 NOTE — Discharge Instructions (Addendum)
Please use compression stockings (knee high) for any continued swelling in either leg

## 2023-01-31 NOTE — ED Triage Notes (Signed)
Pt here with left leg pain. Pt sent from doctor for scans with concerns for DVT. Pt denies pain.

## 2023-01-31 NOTE — ED Provider Notes (Signed)
[facility] Provider Note   Event Date/Time   First MD Initiated Contact with Patient 01/31/23 1653     (approximate) History  Leg Pain  HPI Ryan Gallegos is a 67 y.o. male who presents complaining of intermittent swelling in the left lower extremity.  Patient states that he walks all day for his job and is noted that recently the left lower extremity has become extremely swollen.  Patient also states that when he sits on a barstool at home and gets up he has begun to have some numbness in the first and second toe on the left foot.  Patient states that he was sent by his primary care physician with concern for possible blood clot in the leg given elevated D-dimer to "1000". ROS: Patient currently denies any vision changes, tinnitus, difficulty speaking, facial droop, sore throat, chest pain, shortness of breath, abdominal pain, nausea/vomiting/diarrhea, dysuria, or weakness/paresthesias in any extremity   Physical Exam  Triage Vital Signs: ED Triage Vitals  Encounter Vitals Group     BP 01/31/23 1637 107/79     Systolic BP Percentile --      Diastolic BP Percentile --      Pulse Rate 01/31/23 1637 82     Resp 01/31/23 1637 16     Temp 01/31/23 1637 98.3 F (36.8 C)     Temp Source 01/31/23 1637 Oral     SpO2 01/31/23 1637 96 %     Weight 01/31/23 1638 173 lb 15.1 oz (78.9 kg)     Height 01/31/23 1638 5\' 6"  (1.676 m)     Head Circumference --      Peak Flow --      Pain Score 01/31/23 1638 0     Pain Loc --      Pain Education --      Exclude from Growth Chart --    Most recent vital signs: Vitals:   01/31/23 1637 01/31/23 1906  BP: 107/79 109/78  Pulse: 82 80  Resp: 16 18  Temp: 98.3 F (36.8 C) 98.2 F (36.8 C)  SpO2: 96% 96%   General: Awake, oriented x4. CV:  Good peripheral perfusion.  Resp:  Normal effort.  Abd:  No distention.  Other:  Elderly well-developed, well-nourished African-American male resting comfortably in no acute  distress.  Left lower extremity with trace edema ED Results / Procedures / Treatments  Labs (all labs ordered are listed, but only abnormal results are displayed) Labs Reviewed  CBC - Abnormal; Notable for the following components:      Result Value   Hemoglobin 12.5 (*)    All other components within normal limits  COMPREHENSIVE METABOLIC PANEL - Abnormal; Notable for the following components:   Glucose, Bld 130 (*)    All other components within normal limits  D-DIMER, QUANTITATIVE - Abnormal; Notable for the following components:   D-Dimer, Quant 0.90 (*)    All other components within normal limits   RADIOLOGY ED MD interpretation: Doppler ultrasound of the left lower extremity interpreted independently by me and shows no evidence of DVT at this time -Agree with radiology assessment Official radiology report(s): US Venous Img Lower Unilateral Left  Result Date: 01/31/2023 CLINICAL DATA:  Left leg pain, swelling. EXAM: LEFT LOWER EXTREMITY VENOUS DOPPLER ULTRASOUND TECHNIQUE: Gray-scale sonography with compression, as well as color and duplex ultrasound, were performed to evaluate the deep venous system(s) from the level of the common femoral vein through the popliteal and proximal calf veins.  COMPARISON:  None Available. FINDINGS: VENOUS Normal compressibility of the common femoral, superficial femoral, and popliteal veins, as well as the visualized calf veins. Visualized portions of profunda femoral vein and great saphenous vein unremarkable. No filling defects to suggest DVT on grayscale or color Doppler imaging. Doppler waveforms show normal direction of venous flow, normal respiratory plasticity and response to augmentation. Limited views of the contralateral common femoral vein are unremarkable. OTHER None. Limitations: none IMPRESSION: Negative. Electronically Signed   By: Romona Curls M.D.   On: 01/31/2023 18:33   PROCEDURES: Critical Care performed: No Procedures MEDICATIONS  ORDERED IN ED: Medications - No data to display IMPRESSION / MDM / ASSESSMENT AND PLAN / ED COURSE  I reviewed the triage vital signs and the nursing notes.                             The patient is on the cardiac monitor to evaluate for evidence of arrhythmia and/or significant heart rate changes. Patient's presentation is most consistent with acute presentation with potential threat to life or bodily function. Patient is a 67 year old male who presents for swelling in the left lower extremity.  Differential diagnosis includes but is not limited to DVT, vascular dysfunction, peripheral vascular disease, cellulitis, or arterial occlusion.  Given patient's normal Doppler as well as normal exam with intact sensation and strength in this left lower extremity.  Discussed with patient at length the risks versus benefits of the symptoms including only edema.  Patient states that he does not have any pain and has no dysfunction in terms of ambulation or sensation in this left lower extremity.  Patient encouraged to follow-up with his primary care physician if symptoms become a hindrance to his ADLs.  Dispo: Discharge home with PCP follow-up   FINAL CLINICAL IMPRESSION(S) / ED DIAGNOSES   Final diagnoses:  Leg edema, left   Rx / DC Orders   ED Discharge Orders     None      Note:  This document was prepared using Dragon voice recognition software and may include unintentional dictation errors.   Merwyn Katos, MD 01/31/23 [phone]

## 2023-09-23 ENCOUNTER — Ambulatory Visit
Admission: EM | Admit: 2023-09-23 | Discharge: 2023-09-23 | Disposition: A | Discharge status: Home / Self Care | Attending: Family Medicine | Admitting: Family Medicine

## 2023-09-23 ENCOUNTER — Emergency Department
Admission: EM | Admit: 2023-09-23 | Discharge: 2023-09-23 | Disposition: A | Discharge status: Home / Self Care | Attending: Emergency Medicine | Admitting: Emergency Medicine

## 2023-09-23 ENCOUNTER — Encounter: Payer: Self-pay | Admitting: Emergency Medicine

## 2023-09-23 ENCOUNTER — Other Ambulatory Visit: Payer: Self-pay

## 2023-09-23 ENCOUNTER — Encounter: Payer: Self-pay | Admitting: Intensive Care

## 2023-09-23 ENCOUNTER — Emergency Department

## 2023-09-23 DIAGNOSIS — R2243 Localized swelling, mass and lump, lower limb, bilateral: Secondary | ICD-10-CM

## 2023-09-23 DIAGNOSIS — M7989 Other specified soft tissue disorders: Secondary | ICD-10-CM | POA: Diagnosis present

## 2023-09-23 DIAGNOSIS — R600 Localized edema: Secondary | ICD-10-CM | POA: Diagnosis not present

## 2023-09-23 DIAGNOSIS — I10 Essential (primary) hypertension: Secondary | ICD-10-CM | POA: Insufficient documentation

## 2023-09-23 HISTORY — DX: Pure hypercholesterolemia, unspecified: E78.00

## 2023-09-23 LAB — COMPREHENSIVE METABOLIC PANEL WITH GFR
ALT: 25 U/L (ref 0–44)
AST: 32 U/L (ref 15–41)
Albumin: 4 g/dL (ref 3.5–5.0)
Alkaline Phosphatase: 54 U/L (ref 38–126)
Anion gap: 6 (ref 5–15)
BUN: 18 mg/dL (ref 8–23)
CO2: 25 mmol/L (ref 22–32)
Calcium: 10.2 mg/dL (ref 8.9–10.3)
Chloride: 107 mmol/L (ref 98–111)
Creatinine, Ser: 1.09 mg/dL (ref 0.61–1.24)
GFR, Estimated: >60 mL/min (ref >60)
Glucose, Bld: 104 mg/dL — ABNORMAL HIGH (ref 70–99)
Potassium: 4.4 mmol/L (ref 3.5–5.1)
Sodium: 138 mmol/L (ref 135–145)
Total Bilirubin: 0.9 mg/dL (ref 0.0–1.2)
Total Protein: 8.1 g/dL (ref 6.5–8.1)

## 2023-09-23 LAB — CBC WITH DIFFERENTIAL/PLATELET
Abs Immature Granulocytes: 0.02 10*3/uL (ref 0.00–0.07)
Basophils Absolute: 0.1 10*3/uL (ref 0.0–0.1)
Basophils Relative: 1 %
Eosinophils Absolute: 0.3 10*3/uL (ref 0.0–0.5)
Eosinophils Relative: 4 %
HCT: 43.2 % (ref 39.0–52.0)
Hemoglobin: 13.6 g/dL (ref 13.0–17.0)
Immature Granulocytes: 0 %
Lymphocytes Relative: 40 %
Lymphs Abs: 3 10*3/uL (ref 0.7–4.0)
MCH: 27 pg (ref 26.0–34.0)
MCHC: 31.5 g/dL (ref 30.0–36.0)
MCV: 85.9 fL (ref 80.0–100.0)
Monocytes Absolute: 0.6 10*3/uL (ref 0.1–1.0)
Monocytes Relative: 8 %
Neutro Abs: 3.6 10*3/uL (ref 1.7–7.7)
Neutrophils Relative %: 47 %
Platelets: 251 10*3/uL (ref 150–400)
RBC: 5.03 MIL/uL (ref 4.22–5.81)
RDW: 12.8 % (ref 11.5–15.5)
WBC: 7.5 10*3/uL (ref 4.0–10.5)
nRBC: 0 % (ref 0.0–0.2)

## 2023-09-23 NOTE — ED Triage Notes (Signed)
 Pt c/o bilateral leg swelling and spots on his legs for several months.

## 2023-09-23 NOTE — ED Provider Notes (Signed)
 [facility]    CSN: [id number] Arrival date & time: 09/23/23  0846      History   Chief Complaint Chief Complaint  Patient presents with   Leg Swelling    HPI Ryan Gallegos is a 68 y.o. male with a past medical history of hypertension presenting for lower extremity swelling.  Patient reports 1 month of bilateral lower extremity swelling that is intermittent and does improve with elevation.  States the left is greater than right.  States he does stand a lot for work and notices the swelling worsens afterwards but improves in the morning.  Does have intermittent pain posteriorly to the legs.  Does also have a history of varicose veins.  Does endorse intermittent chest tightness when he notices the swelling but denies chest pain or shortness of breath.  No orthopnea.  Denies any history of CHF or kidney function issues.  Last GFR was greater than 60.  Denies any change in diet including salt intake or alcohol.  He has been seen in the past for lower extremity swelling particularly on the left.  Was seen in September 2024 by his PCP with a D-dimer noted to be greater than 2000 this he was sent to the emergency room where he had a negative ultrasound.  Patient is concerned there is something wrong stating he has a bunch of friends that have been dying recently and he wants to be sure everything is okay.  HPI  Past Medical History:  Diagnosis Date   Erectile dysfunction    Esophagitis    Primary hypertension    Varicose vein of leg     Patient Active Problem List   Diagnosis Date Noted   Current smoker 04/28/2022   Primary hypertension 04/10/2022   Hepatitis C virus infection cured after antiviral drug therapy 01/12/2019   Toe deformity 09/20/2017   Varicose vein of leg 09/20/2017   Candidate for statin therapy due to risk of future cardiovascular event 09/09/2016   Annual physical exam 12/27/2013   Erectile dysfunction 12/27/2013   Esophagitis 10/13/2010    History  reviewed. No pertinent surgical history.     Home Medications    Prior to Admission medications   Medication Sig Start Date End Date Taking? Authorizing Provider  amLODipine (NORVASC) 5 MG tablet Take 1 tablet by mouth daily. 04/10/22 04/10/23  [provider]  aspirin 81 MG EC tablet Take 1 tablet by mouth daily. 12/15/16   [provider]  atorvastatin (LIPITOR) 40 MG tablet Take 40 mg by mouth daily.    [provider]  diphenhydrAMINE  (BENADRYL ) 50 MG tablet Take 1 tablet (50 mg total) by mouth every 6 (six) hours as needed for itching or allergies. 03/07/21   Ward, Clover Dao, DO  ketoconazole  (NIZORAL ) 2 % cream Apply 1 application topically daily. 01/15/21   McDonald, Olive Better, DPM  losartan (COZAAR) 100 MG tablet Take 1 tablet by mouth daily. 03/27/22 03/27/23  [provider]  omeprazole (PRILOSEC) 20 MG capsule Take 20 mg by mouth daily. 09/24/20   [provider]  predniSONE  (STERAPRED UNI-PAK 21 TAB) 10 MG (21) TBPK tablet Take as directed.  Start on 03/08/21. 03/07/21   Ward, Clover Dao, DO  sildenafil (VIAGRA) 100 MG tablet PLEASE SEE ATTACHED FOR DETAILED DIRECTIONS 12/27/20   [provider]    Family History History reviewed. No pertinent family history.  Social History Social History   Tobacco Use   Smoking status: Former   Smokeless tobacco:  Never  Vaping Use   Vaping status: Never Used  Substance Use Topics   Alcohol use: No   Drug use: Not Currently    Types: Marijuana     Allergies   Patient has no known allergies.   Review of Systems Review of Systems  Cardiovascular:  Positive for leg swelling.     Physical Exam Triage Vital Signs ED Triage Vitals  Encounter Vitals Group     BP 09/23/23 0930 120/82     Systolic BP Percentile --      Diastolic BP Percentile --      Pulse Rate 09/23/23 0930 74     Resp 09/23/23 0930 18     Temp 09/23/23 0930 98.2 F (36.8 C)     Temp Source 09/23/23 0930 Oral      SpO2 09/23/23 0930 97 %     Weight --      Height --      Head Circumference --      Peak Flow --      Pain Score 09/23/23 0929 0     Pain Loc --      Pain Education --      Exclude from Growth Chart --    No data found.  Updated Vital Signs BP 120/82 (BP Location: Left Arm)   Pulse 74   Temp 98.2 F (36.8 C) (Oral)   Resp 18   SpO2 97%   Visual Acuity Right Eye Distance:   Left Eye Distance:   Bilateral Distance:    Right Eye Near:   Left Eye Near:    Bilateral Near:     Physical Exam Vitals and nursing note reviewed.  Constitutional:      General: He is not in acute distress.    Appearance: Normal appearance. He is not ill-appearing.  HENT:     Head: Normocephalic and atraumatic.  Eyes:     Pupils: Pupils are equal, round, and reactive to light.  Cardiovascular:     Rate and Rhythm: Normal rate and regular rhythm.     Heart sounds: Normal heart sounds.  Pulmonary:     Effort: Pulmonary effort is normal.     Breath sounds: Normal breath sounds.  Musculoskeletal:     Comments: There is trace edema in bilateral lower extremities that is nonpitting.  There is no tenderness with palpation to posterior calves bilaterally.  Right calf measures 37 cm and left measures 37.5 cm. DP+2 bilaterally   Skin:    General: Skin is warm and dry.  Neurological:     General: No focal deficit present.     Mental Status: He is alert and oriented to person, place, and time.  Psychiatric:        Mood and Affect: Mood normal.        Behavior: Behavior normal.    Clinical feature Score     Active cancer (treatment ongoing or within the previous six months or palliative) 0  Paralysis, paresis, or recent plaster immobilization of the lower extremities 0  Recently bedridden for more than three days or major surgery, within four weeks                                                    0  Localized tenderness along the distribution of the deep venous system 0  Entire leg swollen 0   Calf swelling by more than 3 cm when compared to the asymptomatic leg (measured below tibial tuberosity) 0  Pitting edema (greater in the symptomatic leg) 0  Collateral superficial veins (nonvaricose) 0  Alternative diagnosis as likely or more likely than that of deep venous thrombosis 0                                                                                                                                                             Total Score 0     Interpretation    High probability  3 or greater  Moderate probability 1 or 2  Low probability 0 or less  Modification   This clinical model has been modified to take one other clinical feature into account: a previously documented deep vein thrombosis (DVT) is given the score of 1. Using this modified scoring system, DVT is either likely or unlikely, as follows:  DVT likely 2 or greater   DVT unlikely 1 or less     UC Treatments / Results  Labs (all labs ordered are listed, but only abnormal results are displayed) Labs Reviewed - No data to display  EKG   Radiology No results found.  Procedures Procedures (including critical care time)  Medications Ordered in UC Medications - No data to display  Initial Impression / Assessment and Plan / UC Course  I have reviewed the triage vital signs and the nursing notes.  Pertinent labs & imaging results that were available during my care of the patient were reviewed by me and considered in my medical decision making (see chart for details).     Reviewed exam and symptoms with patient.  Discussed limitations and abilities of urgent care.  Patient primarily concerned about blood clots as a cause of his swelling.  Discussed that exam and Wells score does not indicate this but that I am unable to rule out with 100% certainty in this setting for any underlying concerning pathology, i.e. cardiac, kidneys, PVD or PAD.  Patient wants to be sure he does not die.  Given this I advised to go  to the emergency room for further workup of his symptoms.  He is in agreement with plan will go POV to the emergency room. Final Clinical Impressions(s) / UC Diagnoses   Final diagnoses:  Localized swelling of both lower legs   Discharge Instructions   None    ED Prescriptions   None    PDMP not reviewed this encounter.   Alleen Arbour, NP 09/23/23 1014

## 2023-09-23 NOTE — ED Triage Notes (Signed)
 Patient sent from UC in [place] for US  on left leg to check for blood clot. Reports his left leg has been swelling for months.

## 2023-09-23 NOTE — ED Provider Notes (Signed)
 [facility] Provider Note    Event Date/Time   First MD Initiated Contact with Patient 09/23/23 1118     (approximate)   History   Chief Complaint: Leg Swelling   HPI  Ryan Gallegos is a 68 y.o. male with a past history of hypertension, hyperlipidemia, lower extremity varicose veins who comes ED complaining of intermittent swelling in the bilateral legs, left greater than right it has been going on for months.  Worse during the day while he is upright, better at night with leg elevation.  No fever, no chest pain or shortness of breath.  Denies any pain currently.        Past Medical History:  Diagnosis Date   Erectile dysfunction    Esophagitis    High cholesterol    Primary hypertension    Varicose vein of leg     Current Outpatient Rx   Order #: [id number] Class: Historical Med   Order #: [id number] Class: Historical Med   Order #: [id number] Class: Historical Med   Order #: [id number] Class: Normal   Order #: [id number] Class: Normal   Order #: [id number] Class: Historical Med   Order #: [id number] Class: Historical Med   Order #: [id number] Class: Normal   Order #: [id number] Class: Historical Med    History reviewed. No pertinent surgical history.  Physical Exam   Triage Vital Signs: ED Triage Vitals  Encounter Vitals Group     BP 09/23/23 1054 126/88     Systolic BP Percentile --      Diastolic BP Percentile --      Pulse Rate 09/23/23 1054 84     Resp 09/23/23 1054 16     Temp 09/23/23 1054 97.7 F (36.5 C)     Temp Source 09/23/23 1054 Oral     SpO2 09/23/23 1054 98 %     Weight 09/23/23 1055 160 lb (72.6 kg)     Height 09/23/23 1055 5\' 6"  (1.676 m)     Head Circumference --      Peak Flow --      Pain Score 09/23/23 1054 0     Pain Loc --      Pain Education --      Exclude from Growth Chart --     Most recent vital signs: Vitals:   09/23/23 1054  BP: 126/88  Pulse: 84  Resp: 16  Temp: 97.7 F (36.5 C)  SpO2: 98%     General: Awake, no distress.  CV:  Good peripheral perfusion.  Normal distal pulses, regular rate and rhythm Resp:  Normal effort.  Clear to auscultation bilaterally Abd:  No distention.  Other:  Calves nontender.  Symmetric calf circumference.  No lower extremity edema   ED Results / Procedures / Treatments   Labs (all labs ordered are listed, but only abnormal results are displayed) Labs Reviewed  COMPREHENSIVE METABOLIC PANEL WITH GFR - Abnormal; Notable for the following components:      Result Value   Glucose, Bld 104 (*)    All other components within normal limits  CBC WITH DIFFERENTIAL/PLATELET     EKG    RADIOLOGY Ultrasound of lower extremity interpreted by me, negative for DVT. radiology report reviewed   PROCEDURES:  Procedures   MEDICATIONS ORDERED IN ED: Medications - No data to display   IMPRESSION / MDM / ASSESSMENT AND PLAN / ED COURSE  I reviewed the triage vital signs and the nursing notes.  DDx: DVT, venous insufficiency electrolyte derangement, hypoalbuminemia  Patient's presentation  is most consistent with acute presentation with potential threat to life or bodily function.  Patient presents with chronic leg swelling which occurs during the day and resolves at night with leg elevation.  Currently asymptomatic with normal exam.  He was sent to the ED for ultrasound of the left leg out of abundance of caution which we will obtain today.  Counseled patient on compression socks to help manage symptoms.       FINAL CLINICAL IMPRESSION(S) / ED DIAGNOSES   Final diagnoses:  Peripheral edema     Rx / DC Orders   ED Discharge Orders     None        Note:  This document was prepared using Dragon voice recognition software and may include unintentional dictation errors.   Jacquie Maudlin, MD 09/23/23 1209

## 2023-09-23 NOTE — Discharge Instructions (Addendum)
 Your lab tests and leg ultrasound today were all okay.  Please follow up with primary care for further evaluation of your leg swelling symptoms.
